# Patient Record
Sex: Female | Born: 1980 | Race: White | Hispanic: Yes | Marital: Married | Smoking: Never smoker
Health system: Southern US, Community
[De-identification: ages and names within clinical notes are randomized; demographics above are authoritative.]

## PROBLEM LIST (undated history)

## (undated) HISTORY — PX: NO PAST SURGERIES: SHX2092

---

## 2012-11-06 ENCOUNTER — Encounter: Payer: Self-pay | Admitting: Gynecology

## 2012-11-06 ENCOUNTER — Ambulatory Visit (INDEPENDENT_AMBULATORY_CARE_PROVIDER_SITE_OTHER): Payer: BC Managed Care – PPO | Admitting: Gynecology

## 2012-11-06 VITALS — BP 126/80 | Ht 62.0 in | Wt 161.0 lb

## 2012-11-06 DIAGNOSIS — N915 Oligomenorrhea, unspecified: Secondary | ICD-10-CM

## 2012-11-06 DIAGNOSIS — N946 Dysmenorrhea, unspecified: Secondary | ICD-10-CM

## 2012-11-06 DIAGNOSIS — N949 Unspecified condition associated with female genital organs and menstrual cycle: Secondary | ICD-10-CM

## 2012-11-06 DIAGNOSIS — R635 Abnormal weight gain: Secondary | ICD-10-CM | POA: Insufficient documentation

## 2012-11-06 DIAGNOSIS — R102 Pelvic and perineal pain: Secondary | ICD-10-CM

## 2012-11-06 LAB — TSH: TSH: 0.835 u[IU]/mL (ref 0.350–4.500)

## 2012-11-06 MED ORDER — DOXYCYCLINE HYCLATE 100 MG PO CAPS: ORAL_CAPSULE | ORAL | Status: DC

## 2012-11-06 NOTE — Progress Notes (Signed)
Patient is a 32 year old gravida 2 para 2 new patient to the practice who is having difficulty conceiving. Her primary physician in [facility] [place] Dr. Caleb Popp who has been doing her lab work. Patient stated she had a Pap smear was normal in March of this year. Patient has been with the same partner that she had her 2 previous children. Patient stated that her first pregnancy to 6 years to conceive and in [place] she had laparoscopy and was informed that her tubes may have been blocked and it appears that they may have done some form of tubal plasty because patient stated that after worse she conceived. She did not have problems getting pregnant with a second child. Patient states that sometimes she may go up to 2 months without a menstrual cycle. She states that she has gained weight recently. She denies any unusual headache, visual disturbance, or any nipple discharge. Patient had gestational diabetes with her previous pregnancy and has a strong family history diabetes. Patient does have dysmenorrhea and local discomfort at times and dyspareunia infrequently.  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 The following lateral be ordered today: TSH, prolactin, hemoglobin A1c. We will schedule an HSG with her upcoming menstrual cycle and will also obtain a semen analysis from her husband. Patient will be placed on Vibramycin 100 mg to take one by mouth twice a day for 3 days starting the day before the HSG for prophylaxis. She was instructed to begin taking prenatal vitamins. She will make an appointment to  see me after all the above studies are completed. All the above instructions were provided in Spanish in written and verbal format.

## 2012-11-06 NOTE — Patient Instructions (Addendum)
LLamar a  Claudia [phone] cuando empieze el periodo para que te haga la siguientes cita:  [facility] el [facility]  Cita con Dr. Lily Peer la semana despues del estudio  Cita para el estudio del semen de su esposo  Tomar antibiotico [employer] veces al dia empezando el dia antes del estudio HSG  Infertilidad (Infertility) QU ES LA INFERTILIDAD?  La infertilidad normalmente se define como la incapacidad para quedar embarazada luego de un ao de relaciones sexuales regulares sin la utilizacin de mtodos anticonceptivos. O la incapacidad de llevar a trmino Chartered loss adjuster y Warehouse manager el beb. La tasa de infertilidad en los [place] es de alrededor del 10%. El [address] es el resultado de una cadena de sucesos. La mujer debe liberar el vulo de uno de sus ovarios (ovulacin). El vulo debe fertilizarse con el esperma. Luego viaja a travs de las trompas de Falopio hacia el tero (matriz), donde se une a la pared del tero y crece. El hombre debe tener suficiente esperma y el esperma debe unirse con el vulo (fertilizar) en el momento justo. El vulo fertilizado debe luego unirse al interior del tero. Esto parece simple, pero pueden ocurrir [employer] cosas que evitan que el embarazo se produzca.  DE QUIN ES EL PROBLEMA?  Un 20% de los casos de infertilidad se deben a problemas del hombres (factores masculinos) y un 65% se debe a problemas de la mujer (factores femeninos). Otras causas pueden ser Neomia Dear combinacin de factores masculinos y femeninos o a causas desconocidas.  CULES SON LAS CAUSAS DE LA INFERTILIDAD EN EL HOMBRE?  La infertilidad en el hombre a menudo se debe a problemas para producir el esperma o hacer que el mismo llegue al vulo. Los problemas de esperma pueden existir desde el nacimiento o desarrollarse ms tarde debido a enfermedades o lesiones. Algunos hombres no producen esperma, o producen muy poco (oligospermia). Entre otros problemas se incluyen:  Disfuncin  sexual  Problemas hormonales o endocrinos.  La edad. La fertilidad del hombre disminuye con la edad, pero no tan temprano como la femenina.  Infecciones.  Problemas congnitos Defectos de nacimiento, como ausencia de los tubos que transportan el esperma (conductos deferentes).  Problemas genticos o de cromosomas.  Problemas de anticuerpos antiesperma.  Eyaculacin retrgrada (el esperma va hacia la vejiga).  Varicoceles, espermatoceles o tumores en los testculos.  El estilo de vida puede influir en el nmero y la calidad del esperma del hombre.  El alcohol y las drogas pueden reducir temporalmente la calidad del esperma.  Las toxinas [address], como los pesticidas y el plomo, pueden ocasionar algunos casos de infertilidad en hombres. CULES SON LAS CAUSAS DE LA INFERTILIDAD EN LA MUJER?   La infertilidad en las mujeres est causada mayormente por problemas con la ovulacin. Sin ovulacin, el vulo no puede fertilizarse.  Seales de problemas de ovulacin son perodos menstruales irregulares o ningn perodo.  Factores simples del estilo de vida, como el estrs, la dieta, o el entrenamiento deportivo, pueden afectar el balance hormonal de Medical laboratory scientific officer.  La edad. La fertilidad comienza a IT consultant alrededor de los 30 aos y [place] a Glass blower/designer de los 37.  Mucho menos a menudo, un desequilibrio hormonal por un problema mdico serio como un tumor en la glndula pituitaria, tiroides u otra enfermedad mdica crnica pueden ocasionar problemas de ovulacin.  Infecciones plvicas.  Sndrome de ovarios poliqusticos (aumento de hormonas masculinas, incapacidad de ovular).  Consumo de alcohol o drogas.  Toxinas ambientales, radiacin,  pesticidas y ciertos qumicos.  La edad es un factor importante en la infertilidad femenina.  La capacidad de los ovarios de una mujer para producir vulos disminuye con la edad, especialmente despus de los 35 aos. Alrededor de un  tercio de las parejas en las que la mujer tiene ms de 35 aos tendr problemas de infertilidad.  En el momento en el que alcanza la [place], cuando su perodo menstrual se detiene, la mujer ya no puede producir vulos ni quedar embarazada.  Otros problemas tambin pueden llevar a la infertilidad en las mujeres. Si las trompas de [employer]strom estn bloqueadas en [employer] extremos, el vulo no puede pasar a travs de los tubos [place]. Tejido cicatrizal (adhesiones) en la pelvis que pueden obstruir las trompas. Esto puede dar como resultado una enfermedad inflamatoria plvica, endometriosis o una ciruga por embarazo ectpico (en la que el vulo fertilizado se ha implantado fuera del tero) o cualquier ciruga plvica o abdominal que ocasione adherencias.  Tumores fibroides o plipos en el tero.  Anormalidades congnitas (de nacimiento) del tero.  Infecciones en el cuello del tero (cervicitis).  Estenosis cervical (estrechamiento).  Mucosidad cervical anormal.  Sndrome poliqustico de los ovarios.  Tener relaciones sexuales muy seguidas (todos [place] o 4 a 5 veces por semana).  Obesidad.  Anorexia.  Dficit nutricional.  Mucho ejercicio, con prdida de grasa corporal.  DES. Su madre ha recibido la hormona dietilstilbesterol cuando estaba embarazada de usted. CMO SE ANALIZA LA INFERTILIDAD?  Si ha estado tratando de quedar embarazada sin xito, podra querer buscar ayuda mdica. No debera esperar un ao de intentos sin xito antes de buscar ayuda profesional si:  Tiene mas de 35 aos de edad  Tiene razones para creer que podra tener problemas de fertilidad. Un examen mdico determinar las causas de infertilidad de la pareja, Normalmente el proceso comienza con:  Exmenes fsicos  Historias clnicas de ambos miembros de la pareja.  Historiales sexuales de ambos miembros de la pareja. Si no hay un problema evidente, como relaciones sexuales en tiempos  inadecuados o ausencia de ovulacin, se necesitarn Academic librarian.   Para el hombre, los anlisis normalmente comienzan con pruebas de semen para observar:  La cantidad de esperma.  La forma del esperma.  El movimiento del esperma.  Realizar un historial clnico y quirrgico completo.  Examen fsico.  Control de infecciones en los rganos reproductivos. Podrn realizarle anlisis hormonales.   Para la mujer, el primer paso del anlisis es saber si ovula cada mes. Hay diferentes modos de Designer, industrial/product. Por ejemplo, puede llevar un registro de los cambios en la temperatura corporal por la maana y la textura de la mucosidad cervical. Otra herramienta es un kit de prueba de ovulacin casero, que puede comprarse en la farmacia.  Tambin pueden realizarse controles de ovulacin en el consultorio mdico, mediante anlisis de sangre para observar los niveles de hormonas o pruebas de [employer] ovarios. Si la mujer est ovulando, se necesitarn realizar ms exmenes. Algunas femeninas comunes incluyen:  Histerosalpingografa: Se realizan rayos x de las trompas de Falopio y el tero con una inyeccin de [place]. Mostrar si las trompas estn abiertas y la forma del tero.  Laparoscopa: Un anlisis de las trompas y otros rganos femeninos para Copywriter, advertising. Se utiliza un tubo con iluminacin llamado laparoscopio para observar el interior del abdomen.  Biopsia endometrial: Se toma una muestra del tejido del tero Film/video editor del perodo menstrual, para ver si el  tejido le indica si est ovulando.  Prueba de ultrasonido transvaginal: Examina los rganos femeninos.  Histeroscopa: Utiliza un tubo con iluminacin para examinar el cuello del tero y el tero y ver si hay anormalidades dentro del mismo. TRATAMIENTO Dependiendo de los [employer], se sugerirn IT sales professional. El tratamiento depende de la causa. Entre el 85 y el 90% de los casos de  infertilidad se tratan con drogas o [place].   Hay varias drogas para la fertilidad que pueden utilizarse para las mujeres con problemas de ovulacin. Es importante hablar con el profesional que la asiste sobre la droga a Chemical engineer. Deber comprender los beneficios y efectos colaterales de las drogas. Segn el tipo de droga para la fertilidad y la dosis [place], algunas mujeres podran tener embarazos mltiples (mellizos).  De ser [employer], se puede realizar una ciruga para reparar daos en los ovarios de la [place], trompas de [place], el cuello del tero o el tero.  Tratamiento quirrgico o mdico para la endometriosis o el sndrome de ovario poliqustico. A veces, los problemas de fertilidad en el hombre pueden corregirse con medicamentos o [place].  Inseminacin intrauterina, IUI, de esperma en concordancia con la ovulacin.  Cambios en el estilo de vida, si esta es la causa (prdida de [place], aumento de ejercicios, dejar de fumar, beber excesivamente o tomar drogas ilegales).  Otros tipos de ciruga:  Extirpar tumores por dentro o sobre el tero.  Eliminar tejido cicatrizal de dentro del tero.  Arreglar trompas obstruidas.  Eliminar tejido cicatrizal en la pelvis y alrededor de los rganos femeninos. QU ES LA TECNOLOGA DE REPRODUCCIN ASISTIDA (ART)?  La tecnologa de reproduccin asistida (ART) utiliza mtodos especiales para ayudar a parejas infrtiles. En esta tcnica se manipula tanto el vulo de la mujer como el esperma del hombre. El xito depende de muchos factores. La ART puede ser cara y [address]. Pero ha hecho posible que muchas parejas tengan hijos que de otra manera no hubieran podido. A continuacin se enumeran algunos mtodos:  Fertilizacin. In Vitro (FIV). In Vitro (IVF) es un procedimiento que se hizo famoso con el nacimiento en 1978 de [place], el primer "beb de probeta" del mundo. Se utiliza cuando las trompas de [employer]strom de la mujer estn  bloqueadas o cuando el hombre tiene poco esperma. Se utiliza una droga para estimular los ovarios y producir mltiples vulos. Una vez maduros, los vulos se retiran y se Industrial/product designer en una placa de cultivo con el esperma del hombre para la fertilizacin. Luego de aproximadamente 40 horas, los vulos se examinan para ver si han sido fertilizados por el esperma y se han dividido en clulas. Esos vulos fertilizados (embriones) se colocan luego en el tero de la mujer. Esto evita el paso por las trompas de [place].  La transferencia intrafalopiana de gametas (GIFT) es similar al IVF, pero se utiliza cuando la mujer tiene al menos una trompa de falopio normal. Se colocan de tres a cinco vulos en la trompa de Falopio, junto con el esperma del hombre, para que la fertilizacin se realice dentro del cuerpo de Architectural technologist.  La transferencia intrafalopiana de cigotos (ZIFT), tambin se denomina transferencia embrionaria, y [place] el IVF con el GIFT. Los vulos retirados de los ovarios de la mujer se Land laboratorio y se Industrial/product designer en las trompas de [employer]strom en lugar de en el tero.  Los procedimientos de ART a menudo suponen la utilizacin de donantes de vulos (de [place] mujer) o de embriones congelados  previamente. Los donantes de vulos pueden utilizarse si la mujer tiene [employer] ovarios o posee una enfermedad gentica que podra transmitirla al beb.  Cuando se realiza una ART hay mayor riesgo de embarazos mltiples, mellizos, trillizos o ms.  La inyeccin de esperma intracitoplasmtica es un procedimiento que inyecta un espermatozoide en el vulo para fertilizarlo.  El trasplante embrionario es un procedimiento que comienza con un embrin que se ha desarrollado en un medio especial (solucin qumica) preparado para mantener el embrin vivo por 2 a 5 das, y Conservation officer, historic buildings. En los [employer] que no puede encontrarse la causa y Firefighter no se produce, podr considerarse la  adopcin. Document Released: 03/26/2007 Document Revised: 05/29/2011 [facility] Patient Information 2014 [place], [place]. Histerosalpingografa  (Hysterosalpingography) La histerosalpingografa es un procedimiento que [place] rayos X y [place] sustancia de contraste para observar el interior del tero y las trompas de [place]. La sustancia de contraste se [employer] tero a travs de la vagina y el cuello uterino, [place] se toman radiografas. Este procedimiento puede ayudar al mdico a diagnosticar tumores , adherencias o anormalidades estructurales en el tero. Generalmente se [place] para determinar las razones por las que una mujer no puede tener hijos (infertilidad).  INFORME A SU MDICO SOBRE:   Alergias a medicamentos o alimentos, especialmente a los frutos de mar.  Medicamentos que [place], incluyendo vitaminas, hierbas, gotas oftlmicas, medicamentos de venta libre y cremas.  Uso de corticoides (por va oral o cremas).  Problemas anteriores debido a anestsicos o a medicamentos que [employer] sensibilidad.  Antecedentes de hemorragias o cogulos sanguneos.  Cirugas plvicas previas.  Otros problemas de salud, incluyendo diabetes y problemas renales.  Posible embarazo.  Cualquier alergia al yodo o al contraste usado para tomar radiografas (sustancia de [place])..  Las infecciones plvicas recientes o enfermedades de transmisin sexual (ETS). RIESGOS Y COMPLICACIONES   Infeccin en la membrana que recubre interiormente al tero (endometritis) o en las trompas de Falopio (salpingitis).  [place]ao o perforacin del tero o las trompas de [place].  Reaccin alrgica a la sustancia de [place] para tomar la radiografa. ANTES DEL PROCEDIMIENTO   Planificar el procedimiento despus que finalice su perodo, pero antes de su prxima ovulacin. Esto ocurre por lo general [employer] 5 y 10 de su ltimo perodo. El [place] 1 es Film/video editor de su perodo.  Consulte a  su mdico si debe cambiar o suspender los medicamentos que toma habitualmente.  Podr comer y beber normalmente.  Le administrarn un medicamento para relajarse (sedante) o un analgsico de venta libre para Paramedic las molestias durante el procedimiento.  Antes de comenzar el procedimiento vace la vejiga PROCEDIMIENTO   Deber acostarse en una mesa de radiografas con los pies en los estribos.  Le colocarn en la vagina un dispositivo llamado espculo . Esto permite al mdico observar el interior de la vagina hasta el cuello del tero.  El cuello del tero se lava con un jabn especial.  Luego se pasa un tubo delgado y flexible a travs del cuello del tero hasta el tero.  En el tubo se colocar una sustancia de [place]. Conley Rolls tomarn varias radiografas a medida que el contraste pasa a travs del tero y las trompas de [place].  Despus del procedimiento se retira el tubo.  El procedimiento suele durar entre 15 y 30 minutos. DESPUS DEL PROCEDIMIENTO   La mayor parte de la sustancia de contraste se eliminar naturalmente.  Podra ser necesario que use un apsito sanitario.  Podr sentir algunos clicos y Winferd Humphrey pequea prdida. Luego de 24 horas deben desaparecer.  Consulte con su mdico la fecha en que los resultados estarn disponibles. Asegrese de [employer]. Document Released: 05/29/2011 [facility] Patient Information 2014 [place], [place].

## 2012-11-14 ENCOUNTER — Telehealth: Payer: Self-pay

## 2012-11-14 ENCOUNTER — Other Ambulatory Visit: Payer: Self-pay | Admitting: Gynecology

## 2012-11-14 DIAGNOSIS — IMO0002 Reserved for concepts with insufficient information to code with codable children: Secondary | ICD-10-CM

## 2012-11-14 NOTE — Telephone Encounter (Signed)
Note per Debarah Crape as patient is SPanish speaking. "  Please schedule HSG for JF's patient. Her LMP 11-13-12. She wants morning appt. I will call her with info. Thx

## 2012-11-14 NOTE — Telephone Encounter (Signed)
Ruth Rogers spoke with patient and scheduled her for 11/21/12 at [facility] for HSG.  Order put in system.

## 2012-11-14 NOTE — Telephone Encounter (Signed)
Ruth Rogers will remind her regarding taking Vibramycin bid day before, day of and day after procedure.  Dr. Glenetta Hew had prescribed this at her office visit.

## 2012-11-21 ENCOUNTER — Ambulatory Visit (HOSPITAL_COMMUNITY)
Admission: RE | Admit: 2012-11-21 | Discharge: 2012-11-21 | Disposition: A | Discharge status: Home / Self Care | Payer: BC Managed Care – PPO | Source: Ambulatory Visit | Attending: Gynecology | Admitting: Gynecology

## 2012-11-21 DIAGNOSIS — N979 Female infertility, unspecified: Secondary | ICD-10-CM | POA: Insufficient documentation

## 2012-11-21 DIAGNOSIS — IMO0002 Reserved for concepts with insufficient information to code with codable children: Secondary | ICD-10-CM

## 2012-11-21 MED ORDER — IOHEXOL 300 MG/ML  SOLN
20.0000 mL | Freq: Once | INTRAMUSCULAR | Status: AC | PRN
  Administered 2012-11-21: 20 mL

## 2012-12-04 ENCOUNTER — Encounter: Payer: Self-pay | Admitting: Gynecology

## 2012-12-19 ENCOUNTER — Ambulatory Visit (INDEPENDENT_AMBULATORY_CARE_PROVIDER_SITE_OTHER): Payer: BC Managed Care – PPO | Admitting: Gynecology

## 2012-12-19 ENCOUNTER — Ambulatory Visit: Payer: BC Managed Care – PPO | Admitting: Gynecology

## 2012-12-19 ENCOUNTER — Encounter: Payer: Self-pay | Admitting: Gynecology

## 2012-12-19 VITALS — BP 116/72

## 2012-12-19 DIAGNOSIS — N979 Female infertility, unspecified: Secondary | ICD-10-CM

## 2012-12-19 DIAGNOSIS — N915 Oligomenorrhea, unspecified: Secondary | ICD-10-CM

## 2012-12-19 DIAGNOSIS — R635 Abnormal weight gain: Secondary | ICD-10-CM

## 2012-12-19 LAB — HEMOGLOBIN A1C: Mean Plasma Glucose: 114 mg/dL (ref <117)

## 2012-12-19 MED ORDER — LETROZOLE 2.5 MG PO TABS: ORAL_TABLET | ORAL | Status: DC

## 2012-12-19 NOTE — Patient Instructions (Addendum)
Control del colesterol  Los niveles de colesterol en el organismo estn determinados significativamente por su dieta. Los niveles de colesterol tambin se relacionan con la enfermedad cardaca. El material que sigue ayuda a Software engineer relacin y a Chiropractor qu puede hacer para mantener su corazn sano. No todo el colesterol es [place]. Las lipoprotenas de baja densidad (LDL) forman el colesterol "malo". El colesterol malo puede ocasionar depsitos de grasa que se acumulan en el interior de las arterias. Las lipoprotenas de alta densidad (HDL) es el colesterol "bueno". Ayuda a remover el colesterol LDL "malo" de la [place]. El colesterol es un factor de riesgo muy importante para la enfermedad cardaca. Otros factores de riesgo son la hipertensin arterial, el hbito de fumar, el estrs, la herencia y [place].   El msculo cardaco obtiene el suministro de sangre a travs de las arterias coronarias. Si su colesterol LDL ("malo") est elevado y el HDL ("bueno") es bajo, tiene un factor de riesgo para que se formen depsitos de Holiday representative en las arterias coronarias (los vasos sanguneos que suministran sangre al corazn). Esto hace que haya menos lugar para que la sangre circule. Sin la suficiente sangre y oxgeno, el msculo cardaco no puede funcionar correctamente, y usted podr sentir dolores en el pecho (angina pectoris). Cuando una arteria coronaria se cierra completamente, una parte del msculo cardaco puede morir (infarto de miocardio).  CONTROL DEL COLESTEROL Cuando el profesional que lo asiste enva la sangre al laboratorio para Artist nivel de colesterol, puede realizarle tambin un perfil completo de los lpidos. Con esta prueba, se puede determinar la cantidad total de colesterol, as como los niveles de LDL y HDL. Los triglicridos son un tipo de grasa que circula en la  sangre y que tambin puede utilizarse para determinar el riesgo de enfermedad cardaca. En la siguiente tabla se establecen los nmeros ideales: Prueba: Colesterol total  Menos de 200 mg/dl.  Prueba: LDL "colesterol malo"  Menos de 100 mg/dl.   Menos de 70 mg/dl si tiene riesgo muy elevado de sufrir un ataque cardaco o muerte cardaca sbita.  Prueba: HDL "colesterol bueno"  Mujeres: Ms de 50 mg/dl.   Hombres: Ms de 40 mg/dl.  Prueba: Trigliceridos  Menos de 150 mg/dl.    CONTROL DEL COLESTEROL CON DIETA Aunque factores como el ejercicio y el estilo de vida son importantes, la "primera lnea de ataque" es la dieta. Esto se debe a que se sabe que ciertos alimentos hacen subir el colesterol y otros lo [place]. El objetivo debe ser [employer] alimentos, de modo que tengan un efecto sobre el colesterol y, an ms importante, Microbiologist las grasas saturadas y trans con otros tipos de grasas, como las monoinsaturadas y las poliinsaturadas y cidos grasos omega-3 . En promedio, una persona no debe consumir ms de 15 a 17 g de grasas saturadas por [employer]. Las grasas saturadas y trans se consideran grasas "malas", ya que elevan el colesterol LDL. Las grasas saturadas se encuentran principalmente en productos animales como carne, [place] y crema. Pero esto no significa que usted Marketing executive todas sus comidas favoritas. Actualmente, como lo muestra el cuadro que figura al final de este documento, hay sustitutos de buen sabor, bajos en grasas y en colesterol, para la mayora de los alimentos que a usted Musician. Elija aquellos alimentos alternativos que sean bajos en grasas o sin grasas. Elija cortes de carne del cuarto trasero o lomo ya que estos cortes son los que tienen menor cantidad de Holiday representative  y colesterol. El pollo (sin piel), el pescado, la carne de ternera, y la [place] molida son excelentes opciones. Elimine las carnes [employer] o el salami. Los [employer] o nada de grasas saturadas. Cuando consuma carne [place], carne de aves de corral, o pescado, hgalo en porciones de 85 gramos (3 onzas). Las grasas trans tambin se llaman "aceites parcialmente hidrogenados". Son aceites manipulados cientficamente de [place] que son slidos a Publishing rights manager, tienen una larga vida y Glass blower/designer sabor y la textura de los alimentos a los que se Scientist, clinical (histocompatibility and immunogenetics). Las grasas trans se encuentran en la [place], [place], crackers y alimentos horneados.  Para hornear y cocinar, el aceite es un excelente sustituto para la [place]. Los aceites monoinsaturados tienen un beneficio particular, ya que se cree que disminuyen el colesterol LDL (colesterol malo) y elevan el HDL. Deber evitar los aceites tropicales saturados como el de coco y el de [place].  Recuerde, adems, que puede comer sin restricciones los grupos de alimentos que son naturalmente libres de grasas saturadas y Neurosurgeon trans, entre los que se incluyen el pescado, las frutas (excepto el [place]), verduras, frijoles, cereales (cebada, arroz, [place], trigo) y las pastas (sin salsas con crema)   IDENTIFIQUE LOS ALIMENTOS QUE DISMINUYEN EL COLESTEROL  Pueden disminuir el colesterol las fibras solubles que estn en las frutas, como las [place], en los vegetales como el brcoli, las patatas y las zanahorias; en las legumbres como frijoles, guisantes y Therapist, occupational; y en los cereales como la cebada. Los alimentos fortificados con fitosteroles tambin Engineer, production. Debe consumir al menos 2 g de estos alimentos a diario para Financial planner de disminucin de [place].  En el supermercado, lea las etiquetas de los envases para identificar los alimentos bajos en grasas saturadas, libres de grasas trans y bajos en [place], . Elija quesos que tengan solo de 2 a 3 g de grasa saturada por onza (28,35 g). Use una margarina que no dae el corazn, [place] de grasas trans o aceite parcialmente hidrogenado. Al comprar  alimentos horneados (galletitas dulces y [place]) evite el aceite parcialmente hidrogenado. Los panes y bollos debern ser de granos enteros (harina de maz o de avena entera, en lugar de "harina" o "harina enriquecida"). Compre sopas en lata que no sean cremosas, con bajo contenido de sal y sin grasas adicionadas.   TCNICAS DE PREPARACIN DE LOS ALIMENTOS  Nunca fra los alimentos en aceite abundante. Si debe frer, hgalo en poco aceite y removiendo [place], porque as se utilizan muy pocas grasas, o utilice un spray antiadherente. Cuando le sea posible, hierva, hornee o ase las carnes y cocine los vegetales al vapor. En vez de [employer] con mantequilla o [place], utilice limn y hierbas, pur de Psychologist, educational y canela (para las calabazas y batatas), yogurt y salsa descremados y aderezos para ensaladas bajos en contenido graso.   BAJO EN GRASAS SATURADAS / SUSTITUTOS BAJOS EN GRASA  [place] / Grasas saturadas (g)  Evite: Bife, corte graso (3 oz/85 g) / 11 g   Elija: Bife, corte magro (3 oz/85 g) / 4 g   Evite: [place]a (3 oz/85 g) / 7 g   Elija:  [place] magra (3 oz/85 g) / 5 g   Evite: Jamn (3 oz/85 g) / 6 g   Elija:  Jamn magro (3 oz/85 g) / 2.4 g   Evite: Pollo, con piel (3 oz/85 g), Carne oscura / 4 g   Elija:  Pollo, sin piel (  3 oz/85 g), Carne oscura / 2 g   Evite: Pollo, con piel (3 oz/85 g), Carne magra / 2.5 g   Elija: Pollo, sin piel (3 oz/85 g), Carne magra / 1 g  Lcteos / Grasas saturadas (g)  Evite: Leche entera (1 taza) / 5 g   Elija: Leche con bajo contenido de grasa, 2% (1 taza) / 3 g   Elija: Leche con bajo contenido de grasa, 1% (1 taza) / 1.5 g   Elija: Leche descremada (1 taza) / 0.3 g   Evite: Queso duro (1 oz/28 g) / 6 g   Elija: Queso descremado (1 oz/28 g) / 2-3 g   Evite: Queso cottage, 4% grasa (1 taza)/ 6.5 g   Elija: Queso cottage con bajo contenido de grasa, 1% grasa (1 taza)/ 1.5 g   Evite: Helado (1 taza) / 9 g    Elija: Sorbete (1 taza) / 2.5 g   Elija: Yogurt helado sin contenido de grasa (1 taza) / 0.3 g   Elija: Barras de fruta congeladas / vestigios   Evite: Crema batida (1 cucharada) / 3.5 g   Elija: Batidos glac sin lcteos (1 cucharada) / 1 g  Condimentos / Grasas saturadas (g)  Evite: Mayonesa (1 cucharada) / 2 g   Elija: Mayonesa con bajo contenido de grasa (1 cucharada) / 1 g   Evite: [place] (1 cucharada) / 7 g   Elija: Margarina extra light (1 cucharada) / 1 g   Evite: Aceite de coco (1 cucharada) / 11.8 g   Elija: Aceite de oliva (1 cucharada) / 1.8 g   Elija: Aceite de maz (1 cucharada) / 1.7 g   Elija: Aceite de crtamo (1 cucharada) / 1.2 g   Elija: Aceite de girasol (1 cucharada) / 1.4 g   Elija: Aceite de soja (1 cucharada) / 2.4 g   Elija: Aceite de canola (1 cucharada) / 1 g  Document Released: 03/06/2005 Document Revised: 11/16/2010 [facility] Patient Information 2012 [place], [place]. Exercise to Lose Weight Exercise and a healthy diet may help you lose weight. Your doctor may suggest specific exercises. EXERCISE IDEAS AND TIPS  Choose low-cost things you enjoy doing, such as walking, bicycling, or exercising to workout videos.   Take stairs instead of the elevator.   Walk during your lunch break.   Park your car further away from work or school.   Go to a gym or an exercise class.   Start with 5 to 10 minutes of exercise each day. Build up to 30 minutes of exercise 4 to 6 days a week.   Wear shoes with good support and comfortable clothes.   Stretch before and after working out.   Work out until you breathe harder and your heart beats faster.   Drink extra water when you exercise.   Do not do so much that you hurt yourself, feel dizzy, or get very short of breath.  Exercises that burn about 150 calories:  Running 1  miles in 15 minutes.   Playing volleyball for 45 to 60 minutes.   Washing and waxing a car for 45 to 60 minutes.    Playing touch football for 45 minutes.   Walking 1  miles in 35 minutes.   Pushing a stroller 1  miles in 30 minutes.   Playing basketball for 30 minutes.   Raking leaves for 30 minutes.   Bicycling 5 miles in 30 minutes.   Walking 2 miles in 30 minutes.   Dancing for  30 minutes.   Shoveling snow for 15 minutes.   Swimming laps for 20 minutes.   Walking up stairs for 15 minutes.   Bicycling 4 miles in 15 minutes.   Gardening for 30 to 45 minutes.   Jumping rope for 15 minutes.   Washing windows or floors for 45 to 60 minutes.  Document Released: 04/08/2010 Document Revised: 11/16/2010 Document Reviewed: 04/08/2010 [facility] Patient Information 2012 [place], [place].

## 2012-12-19 NOTE — Progress Notes (Signed)
Patient presented to the office today to discuss the results of her previously scheduled HSG and husband's semen analysis as a result of patient's history of secondary infertility. Patient is a 32 year old gravida 2 para 2 who was seen in the office was 11/06/2012. Patient stated her first pregnancy to occur approximately 6 years to conceive in [place] and was successful after laparoscopy and tuboplasty. Her second pregnancy did not take long to obtain. Patient states that sometimes she will go up to 2 months without a menstrual cycle the past several months she has been regular. She does bleed very light for one to 3 days at most.She denies any unusual headache, visual disturbance, or any nipple discharge. Patient had gestational diabetes with her previous pregnancy and has a strong family history diabetes. Patient does have dysmenorrhea and local discomfort at times and dyspareunia infrequently.   Patient 11/06/2012 had a normal TSH, prolactin and her blood sugar was 100 nonstress. Patient states that a few days ago she felt queasy and weak and she thought that maybe she was pregnant and had a home pregnancy test was negative.  HSG and semen analysis normal recently. Slightly viscous semen analysis.  Assessment/plan: Secondary infertility possible ovulatory dysfunction possible PCO S. Patient has a BMI of 29.4. Patient will be provided literature and information Spanish on exercise and diet. We'll check her hemoglobin A1c today. Patient will be started on ovulation induction medication with letrozole (Femera) 2.5 mg to take one daily from day 3-7 of her cycle. She'll use the ovulation predictor kit to time or intercourse. She'll return to the office on day 20 or 21 or 22 of her cycle to check a serum progesterone level and then to see me 2 weeks afterwards. The risks benefits and pros and cons of ovulation induction medication were discussed with the patient. She was started also on prenatal vitamins. All the  above was provided in written format in Spanish. All questions were answered.

## 2013-01-27 ENCOUNTER — Other Ambulatory Visit: Payer: BC Managed Care – PPO

## 2013-01-28 ENCOUNTER — Other Ambulatory Visit: Payer: BC Managed Care – PPO

## 2013-01-28 DIAGNOSIS — N979 Female infertility, unspecified: Secondary | ICD-10-CM

## 2013-01-28 DIAGNOSIS — N915 Oligomenorrhea, unspecified: Secondary | ICD-10-CM

## 2013-02-10 ENCOUNTER — Ambulatory Visit (INDEPENDENT_AMBULATORY_CARE_PROVIDER_SITE_OTHER): Payer: Self-pay | Admitting: Gynecology

## 2013-02-10 ENCOUNTER — Encounter: Payer: Self-pay | Admitting: Gynecology

## 2013-02-10 VITALS — BP 122/74

## 2013-02-10 DIAGNOSIS — E663 Overweight: Secondary | ICD-10-CM

## 2013-02-10 DIAGNOSIS — N915 Oligomenorrhea, unspecified: Secondary | ICD-10-CM

## 2013-02-10 DIAGNOSIS — E282 Polycystic ovarian syndrome: Secondary | ICD-10-CM

## 2013-02-10 MED ORDER — METFORMIN HCL 500 MG PO TABS: 500.0000 mg | ORAL_TABLET | Freq: Two times a day (BID) | ORAL | Status: DC

## 2013-02-10 NOTE — Patient Instructions (Signed)
Metformin tablets Qu es este medicamento? La METFORMINA se usa para tratar la diabetes tipo 2. Ayuda a controlar el nivel de azcar en la sangre. El tratamiento se combina con ejercicios y una dieta. Este medicamento se puede usar solo o con otros medicamentos para la diabetes, incluyendo la insulina. Este medicamento puede ser utilizado para otros usos; si tiene alguna pregunta consulte con su proveedor de atencin mdica o con su farmacutico. MARCAS COMERCIALES DISPONIBLES: Glucophage Qu le debo informar a mi profesional de la salud antes de tomar este medicamento? Necesita saber si usted presenta alguno de los siguientes problemas o situaciones: -anemia -si consume bebidas alcohlicas con frecuencia -se deshidrata con facilidad -ataque cardiaco -insuficiencia cardiaca tratada con medicamentos -enfermedad renal -enfermedad heptica -ovarios poliqusticos -infeccin o lesin severa -vmito -una reaccin alrgica o inusual a la metformina, a otros medicamentos, alimentos, colorantes o conservantes -si est embarazada o buscando quedar embarazada -si est amamantando a un beb Cmo debo utilizar este medicamento? Tome este medicamento por va oral. Tmelo con las comidas. Trague las tabletas con un vaso de agua. Siga las instrucciones de la etiqueta del medicamento. Tome sus dosis a intervalos regulares. No tome su medicamento con una frecuencia mayor a la indicada. Hable con su pediatra para informarse acerca del uso de este medicamento en nios. Aunque este medicamento se puede recetar a nios tan menores como de 10 aos de edad para condiciones selectivas, las precauciones se aplican. Sobredosis: Pngase en contacto inmediatamente con un centro toxicolgico o una sala de urgencia si usted cree que haya tomado demasiado medicamento. ATENCIN: Este medicamento es solo para usted. No comparta este medicamento con nadie. Qu sucede si me olvido de una dosis? Si olvida una dosis, tmela  lo antes posible. Si es casi la hora de su dosis siguiente, tome slo esa dosis. No tome dosis adicionales o dobles. Qu puede interactuar con este medicamento? No tome esta medicina con ninguno de los siguientes medicamentos: -dofetilida -gatifloxacino -ciertos agentes de contraste administrados antes de un procedimiento con rayos X, tomografas computadas (CT), MRI u otros procedimientos Muchos medicamentos pueden aumentar o reducir el nivel de azcar en la sangre, tales como: -digoxina -diurticos -hormonas femeninas, como estrgenos, progestinas o pldoras anticonceptivas -isoniazida -medicamentos para presin sangunea, enfermedad cardiaca, pulso cardiaco irregular -morfina -cido nicotnico -fenotiazinas, tales como clorpromacina, mesoridazina, proclorperazina, tioridazina -fenitona -procainamida -quinidina -quinina -ranitidina -medicamentos esteroideos, como la prednisona o la cortisona -medicamentos estimulantes para trastornos de atencin, perder peso o mantenerse despierto -medicamentos tiroideos -trimetoprima -vancomicina Puede ser que esta lista no menciona todas las posibles interacciones. Informe a su profesional de la salud de todos los productos a base de hierbas, medicamentos de venta libre o suplementos nutritivos que est tomando. Si usted fuma, consume bebidas alcohlicas o si utiliza drogas ilegales, indqueselo tambin a su profesional de la salud. Algunas sustancias pueden interactuar con su medicamento. A qu debo estar atento al usar este medicamento? Visite a su mdico o a su profesional de la salud para chequear su evolucin peridicamente. Un examen llamada HbA1C (A1C) ser monitoreado. Es un simple examen de sangre. Mide su control de azcar en la sangre durante los ltimos 2 a 3 meses. Usted recibir este examen cada 3 a 6 meses. Aprenda cmo controlar el nivel de azcar en la sangre. Aprenda a reconocer los sntomas de bajo y alto nivel de azcar en la  sangre y cmo tratarlos. Siempre lleve consigo una fuente rpida de azcar por si acaso experimenta sntomas de bajo nivel de azcar en   la sangre. Ejemplos incluyen caramelos duros o tabletas de glucosa. Asegrese de que los miembros de su familia sepan que se puede ahogar si come o bebe mientras tiene sntomas graves de bajo nivel de azcar en la sangre, tales como convulsiones o prdida del conocimiento. Deben obtener ayuda mdica inmediatamente. Informe a su mdico o a su profesional de la salud si tiene alto nivel de azcar en la sangre. Tal vez sea necesario cambiar la dosis de su medicamento. Si est enfermo o haciendo mucho ms ejercicio que el habitual, puede ser necesario cambiar la dosis de su medicamento. No se salte comidas. Pregunte a su mdico o a su profesional de la salud si debe evitar el consumo de alcohol. Muchos productos de venta libre para tos y resfros contienen azcar y alcohol. Estos pueden afectar el nivel de azcar en la sangre. Este medicamento puede provocar la ovulacin en mujeres premenopusicas que no tienen periodos menstruales regulares. Esto puede aumentar la posibilidad de quedarse embarazada. No debe tomar este medicamento si se queda embarazada o si cree que est embarazada. Consulte a su mdico o su profesional de la salud sobre sus opciones anticonceptivas mientras est tomando este medicamento. Si cree que est embarazada, consulte a su mdico o su profesional de la salud inmediatamente. Si va a someterse a una operacin, IRM (MRI), tomografa computarizada u otro procedimiento, informe a su mdico que est tomando este medicamento. Usted podr necesitar dejar de tomar este medicamento antes del procedimiento.  Use una pulsera o cadena de identificacin mdica. Lleve consigo una tarjeta de identificacin con informacin sobre su enfermedad y detalles de sus medicamentos y los horarios de las dosis. Qu efectos secundarios puedo tener al utilizar este  medicamento? Efectos secundarios que debe informar a su mdico o a su profesional de la salud tan pronto como sea posible: -reacciones alrgicas como erupcin cutnea, picazn o urticarias, hinchazn de la cara, labios o lengua -problemas respiratorios -sensacin de desmayos o aturdimiento, cadas -dolores o molestias musculares -signos o sntomas de bajo nivel de azcar en la sangre tales como sentirse ansioso, confusin, mareos, aumento de apetito, debilidad o cansancio inusual, sudoracin, temblores, fro, irritabilidad, dolor de cabeza, visin borrosa, pulso cardaco rpido, prdida del conocimiento -pulso cardiaco irregular o lento -molestias o dolor de estmago inusual -cansancio o debilidad inusual Efectos secundarios que, por lo general, no requieren atencin mdica (debe informarlos a su mdico o a su profesional de la salud si persisten o si son molestos): -diarrea -dolor de cabeza -acidez de estmago -sabor metlico en la boca -nuseas -molestias estomacales, gases Puede ser que esta lista no menciona todos los posibles efectos secundarios. Comunquese a su mdico por asesoramiento mdico sobre los efectos secundarios. Usted puede informar los efectos secundarios a la FDA por telfono al 1-800-FDA-1088. Dnde debo guardar mi medicina? [place] fuera del alcance de los nios. Gurdela a temperatura ambiente, entre 15 y 30 grados C (59 y 86 grados F). Protjala de la humedad y de la luz. Deseche todo el medicamento que no haya utilizado, despus de la fecha de vencimiento. ATENCIN: Este folleto es un resumen. Puede ser que no cubra toda la posible informacin. Si usted tiene preguntas acerca de esta medicina, consulte con su mdico, su farmacutico o su profesional de la salud.  2014, Elsevier/Gold Standard. (2012-07-12 17:31:08)  

## 2013-02-10 NOTE — Progress Notes (Signed)
Patient is a 32 year old gravida 2 para 2 with history of secondary infertility/oligomenorrhea. Patient in [place] had undergone laparoscopy and tubal pregnancy and conceived with her first pregnancy shortly thereafter. Second pregnancy was spontaneous. Patient has had a workup here in the office to consist of a normal TSH, and prolactin her sugar was found to be slightly borderline at 110. HSG and semen analysis were normal with exception of slightly viscous semen. Patient has a BMI of 29.4 and we had discussed importance of regular exercise to help with her weight as well as her sugar and to help her ovulate. This is patient's second cycle on Femera 2.5 mg which she takes from day 3-7 of her cycle. Last month she had a normal menstrual cycle but her husband was out of town and did not have intercourse in a timely fashion. She had a normal serum progesterone level with a value of 11.3. She is currently on day 7 of her cycle and she will be started on metformin 500 mg twice a day to keep her blood sugars under control as well as to help with ovulation. The risks benefits and pros and cons were discussed in Spanish as well. She will continue to time her intercourse next week which will be day 13 and 14 of her cycle. It appears that she may have had trouble interpreting the ovulation predictor kit and I have calculated for her that regardless of the results of the ovulation predictor kit next week end which will be day 13 and 14 over cycle that she should have intercourse at that time. She will continue on her prenatal vitamins and she will contact us. Her husband will be in town that time. All the above instructions were provided in Spanish in written format as well.

## 2013-04-21 ENCOUNTER — Ambulatory Visit (INDEPENDENT_AMBULATORY_CARE_PROVIDER_SITE_OTHER): Payer: Self-pay | Admitting: Gynecology

## 2013-04-21 ENCOUNTER — Encounter: Payer: Self-pay | Admitting: Gynecology

## 2013-04-21 VITALS — BP 114/74

## 2013-04-21 DIAGNOSIS — N915 Oligomenorrhea, unspecified: Secondary | ICD-10-CM

## 2013-04-21 DIAGNOSIS — N979 Female infertility, unspecified: Secondary | ICD-10-CM

## 2013-04-21 MED ORDER — LETROZOLE 2.5 MG PO TABS: ORAL_TABLET | ORAL | Status: DC

## 2013-04-21 NOTE — Progress Notes (Signed)
Patient is a 32-year-old gravida 2 para 2 with history of secondary infertility/oligomenorrhea. Patient in [place] had undergone laparoscopy and tubal pregnancy and conceived with her first pregnancy shortly thereafter. Second pregnancy was spontaneous. Patient has had a workup here in the office to consist of a normal TSH, and prolactin her sugar was found to be slightly borderline at 110. HSG and semen analysis were normal with exception of slightly viscous semen. The patient is a BMI of 29.4. We discussed importance of regular exercise. Patient had been started on  Femera  2.5 mg daily 3 through 7 as a result of her oligomenorrhea and was to time her intercourse. She did so and had a normal serum progesterone level with a value of 11.3. She has not conceived and has gone through 4 cycles. One of the cycles her husband was not in town because he was traveling. Because of her pre-diabetes she was started on metformin 500 mg twice a day and has informed me  today that this is causing her to have much nausea and abdominal cramping. She's currently on her prenatal vitamins.  Her last menstrual period was January 9 and took the Femera as prescribed and days 3-7 and did notice on the ovulation predictor kit on day 12 and 13 a positive response and had intercourse. She is currently on day 25 of her cycle.  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 She's currently on her fourth cycle day 25. She is not conceive with this cycle were going to give her one more cycle along with sperm washing and IUI. Her husband will be out of town for the next several weeks so we will do this cycle starting in March. We'll have her decrease her metformin because of her side effects to one tablet daily. Will increase her Femera to 5 mg from day 3-7 starting in March and will also add dexamethasone 0.2 mg from day to-16 of that cycle in March. She will then  call the office when she notices any change in the ovulation predictor kit so that we can schedule sperm washing and intrauterine insemination. The risks benefits and pros and cons of those medications were discussed in detail in Spanish. 

## 2013-04-24 ENCOUNTER — Other Ambulatory Visit: Payer: Self-pay | Admitting: Gynecology

## 2013-04-24 MED ORDER — DEXAMETHASONE 0.5 MG PO TABS: ORAL_TABLET | ORAL | Status: DC

## 2013-05-23 ENCOUNTER — Other Ambulatory Visit: Payer: Self-pay | Admitting: Gynecology

## 2013-05-23 ENCOUNTER — Other Ambulatory Visit: Payer: Self-pay | Admitting: Anesthesiology

## 2013-05-23 MED ORDER — METFORMIN HCL 500 MG PO TABS: 500.0000 mg | ORAL_TABLET | Freq: Two times a day (BID) | ORAL | Status: DC

## 2013-05-23 MED ORDER — LETROZOLE 2.5 MG PO TABS: ORAL_TABLET | ORAL | Status: DC

## 2013-05-23 MED ORDER — DEXAMETHASONE 0.5 MG PO TABS: ORAL_TABLET | ORAL | Status: DC

## 2013-06-04 ENCOUNTER — Telehealth: Payer: Self-pay | Admitting: *Deleted

## 2013-06-04 NOTE — Telephone Encounter (Signed)
Pt called Ruth Rogers this morning had a +OPK needs IUI tomorrow morning. Sperm washing at 8am in [facility]. Pt informed by Rosemarie Ax and order faxed KW CMA

## 2013-06-05 ENCOUNTER — Ambulatory Visit (INDEPENDENT_AMBULATORY_CARE_PROVIDER_SITE_OTHER): Payer: Self-pay | Admitting: Gynecology

## 2013-06-05 ENCOUNTER — Encounter: Payer: Self-pay | Admitting: Gynecology

## 2013-06-05 VITALS — BP 124/80

## 2013-06-05 DIAGNOSIS — E663 Overweight: Secondary | ICD-10-CM

## 2013-06-05 DIAGNOSIS — N915 Oligomenorrhea, unspecified: Secondary | ICD-10-CM

## 2013-06-05 DIAGNOSIS — N979 Female infertility, unspecified: Secondary | ICD-10-CM

## 2013-06-05 DIAGNOSIS — Z3189 Encounter for other procreative management: Secondary | ICD-10-CM

## 2013-06-05 NOTE — Progress Notes (Signed)
   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  Patient with past history of oligomenorrhea. Ovulation induction medication previously attempted as follows: First cycle with clomiphene citrate 100 mg per day 5 through 9 unsuccessful Patient went for 4 cycles with  Femera  2.5 mg for days  3-7 with timing of intercourse with ovulation predictor kit no success despite positive response  The patient has had serum progesterone level which have indicated that she has responded to medication. She's had a previous normal HSG but husband's semen analysis was normal but this does.  Patient's last menstrual period was March 5 and from March 7-11 she took the Femera  2.5 mg daily and on day 14 which was yesterday she noted a positive change on ovulation predictor kit.  Sperm Washing today result:  Pre-wash: Volume 3.5  Motility 43.5%  Post wash Volume 0.78 Motility 61.5%  The patient was placed in the low lithotomy position in the examining table. A speculum was inserted. The sperm sample the post wash was inserted into the uterine cavity with a sterile catheter. She was asked to rest for 15 minutes and the exam room before leaving home. She'll have intercourse tonight in the next 2 nights. If she has a menstrual cycle in the next couple weeks or return to the office for discussion of further management plan:   

## 2013-06-06 ENCOUNTER — Encounter: Payer: Self-pay | Admitting: Gynecology

## 2013-07-15 ENCOUNTER — Ambulatory Visit (INDEPENDENT_AMBULATORY_CARE_PROVIDER_SITE_OTHER): Payer: Self-pay | Admitting: Gynecology

## 2013-07-15 ENCOUNTER — Encounter: Payer: Self-pay | Admitting: Gynecology

## 2013-07-15 VITALS — BP 128/76

## 2013-07-15 DIAGNOSIS — R7303 Prediabetes: Secondary | ICD-10-CM | POA: Insufficient documentation

## 2013-07-15 DIAGNOSIS — R7309 Other abnormal glucose: Secondary | ICD-10-CM

## 2013-07-15 DIAGNOSIS — E663 Overweight: Secondary | ICD-10-CM

## 2013-07-15 DIAGNOSIS — N915 Oligomenorrhea, unspecified: Secondary | ICD-10-CM

## 2013-07-15 MED ORDER — DEXAMETHASONE 0.5 MG PO TABS: ORAL_TABLET | ORAL | Status: DC

## 2013-07-15 MED ORDER — LETROZOLE 2.5 MG PO TABS: ORAL_TABLET | ORAL | Status: DC

## 2013-07-15 NOTE — Patient Instructions (Addendum)
Fertilizacin in vitro  (In Vitro Fertilization) La fertilizacin in vitro (FIV) es un tipo de Chartered certified accountant aplicada a la reproduccin asistida. La FIV consiste en una serie de procedimientos para tratar la infertilidad o problemas genticos con el objeto de prestar asistencia para la concepcin de un beb. Durante la FIV, los vulos se recuperan de los ovarios y se combinan con los espermatozoides en el laboratorio para fertilizarlos. Uno o ms vulos fertilizados (embriones) se insertan en el tero a travs del cuello uterino. Las candidatas para la FIV son:  Personas que sufren infertilidad.  Las mujeres que sufren una menopausia prematura o una falla ovrica.  Las mujeres a las que se les han extirpado ambos ovarios. En este caso, deber usarse una donante de vulos.  Mujeres que tienen daadas u obstruidas las trompas de [place] No hay lmite de edad para la FIV, pero no se recomienda para las mujeres postmenopusicas. La edad ideal para este procedimiento es de 80 aos o menos. A las mujeres de ms de 41 aos generalmente se les aconseja utilizar una donante de vulos durante la FIV para aumentar las probabilidades de [place]. INFORME A SU MDICO:   Cualquier alergia que tenga.  Todos los [employer] [place], incluyendo vitaminas, hierbas, gotas oftlmicas, cremas y medicamentos de venta libre.  Problemas previos que usted o los [employer] de su familia hayan tenido con el uso de anestsicos.  Todo problema mdico o gentico que usted o los miembros de su familia hayan sufrido.  Enfermedades de [employer].  Cirugas previas.  Embarazos previos.  Padecimientos mdicos.  Excesos en el consumo de alcohol o de tabaco.  Historia de consumo de drogas. RIESGOS Y COMPLICACIONES  Generalmente, el procedimiento de FIV es un procedimiento seguro. Sin embargo, Games developer procedimiento, pueden surgir complicaciones. Las complicaciones posibles son:  Almon Hercules o  infeccin.  Problemas con la anestesia.  Cogulos sanguneos.  El procedimiento no tiene xito.  Tener gemelos o embarazos mltiples  Aumento del riesgo de [place]. ANTES DEL PROCEDIMIENTO  Antes de comenzar el ciclo de FIV, usted y el donante sern sometidos a estudios para asegurarse de que la FIV es la mejor opcin. Algunas personas pueden no beneficiarse con este tipo de tecnologa para la asistencia reproductiva.   Usted y Transport planner tendrn que proporcionar una historia clnica completa y la historia clnica de sus familiares.  Usted y Transport planner sern sometidos a un examen fsico.  Usted y el donante podrn necesitar realizarse anlisis de sangre para detectar enfermedades infecciosas, incluyendo el VIH.  Podrn solicitarle otros estudios, por ejemplo:  Estudio de los ovarios para determinar la calidad y la cantidad de vulos.  Estudios hormonales y de ovulacin.  Un examen del tero. Este se realiza [employer] de un tipo de Magazine features editor, despus de Tour manager un lquido en el tero a travs del cuello uterino (ecohisterografa o utilizando un tubo delgado y flexible, con Ardelia Mems pequea luz y cmara en un extremo (histeroscopio).  La esperma del donante ser tomada y [place] para ver si es normal, hay cantidad suficiente para fertilizar el vulo y que actan normalmente despus de las relaciones sexuales (examen postcoital ). PROCEDIMIENTO  La FIV consiste en varios pasos. Estos procedimientos pueden Associate Professor mdico o en una clnica. Un ciclo de FIV puede llevar alrededor de 2 semanas, y podr requerirse ms de un ciclo. Los pasos de la FIV son:  IT consultant. Si se utilizan sus vulos durante la FIV, al comienzo del ciclo  Un ciclo de FIV puede llevar alrededor de 2 semanas, y podrá requerirse más de un ciclo. Los pasos de la FIV son:  · Estimulación ovárica. Si se utilizan sus óvulos durante la FIV, al comienzo del ciclo comenzará un tratamiento con hormonas artificiales (sintéticas. Estas hormonas estimulan los ovarios para producir múltiples óvulos, a diferencia del óvulo único que normalmente se desarrolla cada mes. Es necesario obtener  múltiples óvulos debido a que algunos de ellos no se fertilizarán o no se desarrollarán normalmente después de la fertilización.  · Extracción de óvulos. Utilizando imágenes radiográficas como guía, el medico insertará una aguja fina a través de la vagina y la dirigirá a los ovarios y sacos (folículos) que contienen los óvulos La aguja se conecta a un dispositivo de succión, que retira los óvulos y el líquido de cada folículo, uno por vez. El procedimiento se repite para el otro ovario.  · Inseminación y fertilización. El esperma se une a los ovarios (inseminación) y se almacena en una cámara que tiene un ambiente controlado. Generalmente el esperma ingresa (fertiliza) el óvulo unas horas después de la inseminación.  · Transferencia embrionaria. El médico colocará los embriones en su útero usando un tubo delgado (catéter) que contiene los embriones. El catéter se inserta en la vagina a través del cuello uterino, y de allí al útero. La transferencia de óvulos generalmente ocurre entre 2 a 6 días luego de la extracción de los óvulos. Si hay éxito, el embrión se adherirá (implante) en la superficie interna del útero alrededor de 6 a 10 días luego de la extracción de los óvulos. Si se implanta el embrión en la membrana que cubre el útero y se desarrolla, resulta en un embarazo.  DESPUÉS DEL PROCEDIMIENTO   · Tendrá que permanecer acostada durante una hora o más, antes de volver a su casa.  · Será necesario que siga con la terapia hormonal por 3 meses aproximadamente, o según lo que le indique su médico.  · Puede retomar su dieta y las actividades habituales.  Document Released: 11/06/2012  ExitCare® Patient Information ©2014 [employer] LLC.

## 2013-07-15 NOTE — Progress Notes (Signed)
   The patient presents to the office for followup on her secondary infertility please see note dated every second 2015 for additional detail.  Patient with past history of oligomenorrhea. Ovulation induction medication previously attempted as follows:  First cycle with clomiphene citrate 100 mg per day 5 through 9 unsuccessful  Patient went for 4 cycles with Femera 2.5 mg for days 3-7 with timing of intercourse with ovulation predictor kit no success despite positive response  The patient has had serum progesterone level which have indicated that she has responded to medication. She's had a previous normal HSG but husband's semen analysis but decreased motility secondary to viscosity.  On March 19 she underwent a sperm washing and intrauterine insemination after a positive change in her ovulation predictor kit. A sperm washing results were as follows:  Pre-wash: Volume 3.5  Motility 43.5%  Post wash  Volume 0.78  Motility 61.5%  Patient presented to the office today to go through 1 more cycle of sperm washing and intrauterine insemination. We will re- stimulated with Femara 2.5 mg from day 3-7 of the cycle. She will continue with her metformin 500 mg 1 by mouth twice a day since she is prediabetic and overweight. She will also use the dexamethasone 0.5 mg from day 2-18 to help with her ovulation. If she is not successful with the cycle we had discussed at the next step would be referral to reproductive endocrinologist for consideration of in vitro fertilization since she is now 33 years of age. All the above instructions were provided in Spanish. Patient of potential risk of ovulation induction medication to include hyperstimulation and multiple gestation.

## 2013-07-25 ENCOUNTER — Ambulatory Visit (INDEPENDENT_AMBULATORY_CARE_PROVIDER_SITE_OTHER): Payer: Self-pay | Admitting: Gynecology

## 2013-07-25 ENCOUNTER — Encounter: Payer: Self-pay | Admitting: Gynecology

## 2013-07-25 ENCOUNTER — Telehealth: Payer: Self-pay | Admitting: *Deleted

## 2013-07-25 VITALS — BP 114/76

## 2013-07-25 DIAGNOSIS — Z3189 Encounter for other procreative management: Secondary | ICD-10-CM

## 2013-07-25 DIAGNOSIS — N970 Female infertility associated with anovulation: Secondary | ICD-10-CM

## 2013-07-25 NOTE — Progress Notes (Signed)
   Patient presented to the office today for intrauterine insemination after sperm washing as a result of secondary infertility (oligomenorrhea, decrease sperm motility). Past treatments have included the following:  Ovulation induction medication previously attempted as follows:  First cycle with clomiphene citrate 100 mg per day 5 through 9 unsuccessful  Patient went for 4 cycles with Femera 2.5 mg for days 3-7 with timing of intercourse with ovulation predictor kit no success despite positive response  The patient has had serum progesterone level which have indicated that she has responded to medication. She's had a previous normal HSG but husband's semen analysis but decreased motility secondary to viscosity.  On April 28 of last cycle and had the same protocol used above along with sperm washing and insemination.  Pre-wash: Volume 3.5  Motility 43.5%  Post wash  Volume 0.78  Motility 61.5%  The patient wanted to try one more time with intrauterine insemination after sperm washing before being referred to reproductive endocrinologist for consideration of in vitro fertilization. She took her Femera 2.5 mg from day 3 through 7 with cycle. She continued with the metformin 500 mg 1 by mouth twice a day since she was P. diabetic and overweight. She was started on dexamethasone 0.5 mg from day 2-18 to help with her ovulation. Today is day 12 of her cycle and she noted a positive change in the ovulation predictor kit.  Sperm washing today demonstrated motility was 56% and improved to 72% after washing. Sperm count 83 million sperm volume was 3.5 cc  The patient was inseminated with husband sperm after sperm washing as noted above with a sterile catheter and the sperm was deposited into the intrauterine cavity. She rested for 10 minutes before being released home. We'll await to see if she is conceived with a cycle. Of note she had clear mucous noted at the cervix with good spinberkeit.

## 2013-07-25 NOTE — Telephone Encounter (Signed)
Pt called with +OPK will do IUI today. Sperm washing At Dr Waldon Reining office at 1pm then will come to office for IUI. All informed by [place] in [place]. KW CMA

## 2013-07-28 ENCOUNTER — Encounter: Payer: Self-pay | Admitting: Gynecology

## 2013-10-07 ENCOUNTER — Ambulatory Visit: Payer: Self-pay | Admitting: Gynecology

## 2014-01-19 ENCOUNTER — Encounter: Payer: Self-pay | Admitting: Gynecology

## 2014-03-30 ENCOUNTER — Other Ambulatory Visit (HOSPITAL_COMMUNITY)
Admission: RE | Admit: 2014-03-30 | Discharge: 2014-03-30 | Disposition: A | Discharge status: Home / Self Care | Payer: BLUE CROSS/BLUE SHIELD | Source: Ambulatory Visit | Attending: Gynecology | Admitting: Gynecology

## 2014-03-30 ENCOUNTER — Encounter: Payer: Self-pay | Admitting: Gynecology

## 2014-03-30 ENCOUNTER — Ambulatory Visit (INDEPENDENT_AMBULATORY_CARE_PROVIDER_SITE_OTHER): Payer: BLUE CROSS/BLUE SHIELD | Admitting: Gynecology

## 2014-03-30 VITALS — BP 128/84 | Ht 62.0 in | Wt 165.0 lb

## 2014-03-30 DIAGNOSIS — Z1151 Encounter for screening for human papillomavirus (HPV): Secondary | ICD-10-CM | POA: Diagnosis present

## 2014-03-30 DIAGNOSIS — Z01419 Encounter for gynecological examination (general) (routine) without abnormal findings: Secondary | ICD-10-CM | POA: Diagnosis present

## 2014-03-30 LAB — COMPREHENSIVE METABOLIC PANEL
ALBUMIN: 3.9 g/dL (ref 3.5–5.2)
ALT: 12 U/L (ref 0–35)
AST: 14 U/L (ref 0–37)
Alkaline Phosphatase: 76 U/L (ref 39–117)
BUN: 10 mg/dL (ref 6–23)
CHLORIDE: 105 meq/L (ref 96–112)
CO2: 28 mEq/L (ref 19–32)
CREATININE: 0.67 mg/dL (ref 0.50–1.10)
Calcium: 9.2 mg/dL (ref 8.4–10.5)
GLUCOSE: 100 mg/dL — AB (ref 70–99)
Potassium: 3.9 mEq/L (ref 3.5–5.3)
SODIUM: 138 meq/L (ref 135–145)
TOTAL PROTEIN: 6.7 g/dL (ref 6.0–8.3)
Total Bilirubin: 0.5 mg/dL (ref 0.2–1.2)

## 2014-03-30 LAB — CBC WITH DIFFERENTIAL/PLATELET
BASOS PCT: 1 % (ref 0–1)
Basophils Absolute: 0.1 10*3/uL (ref 0.0–0.1)
EOS PCT: 2 % (ref 0–5)
Eosinophils Absolute: 0.1 10*3/uL (ref 0.0–0.7)
HCT: 39 % (ref 36.0–46.0)
Hemoglobin: 12.7 g/dL (ref 12.0–15.0)
Lymphocytes Relative: 37 % (ref 12–46)
Lymphs Abs: 2.6 10*3/uL (ref 0.7–4.0)
MCH: 28.1 pg (ref 26.0–34.0)
MCHC: 32.6 g/dL (ref 30.0–36.0)
MCV: 86.3 fL (ref 78.0–100.0)
MONOS PCT: 6 % (ref 3–12)
MPV: 9.4 fL (ref 8.6–12.4)
Monocytes Absolute: 0.4 10*3/uL (ref 0.1–1.0)
NEUTROS ABS: 3.8 10*3/uL (ref 1.7–7.7)
Neutrophils Relative %: 54 % (ref 43–77)
Platelets: 260 10*3/uL (ref 150–400)
RBC: 4.52 MIL/uL (ref 3.87–5.11)
RDW: 14.2 % (ref 11.5–15.5)
WBC: 7.1 10*3/uL (ref 4.0–10.5)

## 2014-03-30 LAB — LIPID PANEL
Cholesterol: 175 mg/dL (ref 0–200)
HDL: 44 mg/dL (ref >39)
LDL Cholesterol: 81 mg/dL (ref 0–99)
Total CHOL/HDL Ratio: 4 Ratio
Triglycerides: 248 mg/dL — ABNORMAL HIGH (ref <150)
VLDL: 50 mg/dL — ABNORMAL HIGH (ref 0–40)

## 2014-03-30 NOTE — Addendum Note (Signed)
Addended by: Thurnell Garbe A on: 03/30/2014 03:26 PM   Modules accepted: Orders, SmartSet

## 2014-03-30 NOTE — Progress Notes (Signed)
Dafina Suk 03/29/1980 [id number]   History:    34 y.o.  gravida 2 para 2 who presented to the office today for her annual gynecological exam. Patient was asymptomatic. Patient now reports normal menstrual cycles and no longer interested in getting pregnant. Last year she had been treated for oligomenorrhea and decreased female sperm motility and did not conceive after 5 cycles of ovulation induction medication. Patient with no previous history of any abnormal Pap smear. Her PCP in [facility] several years ago had done her Pap smear we have no documentation. Patient's mother had history of non-insulin-dependent diabetes.   Past medical history,surgical history, family history and social history were all reviewed and documented in the EPIC chart.  Gynecologic History Patient's last menstrual period was 03/16/2014. Contraception: none Last Pap: Several years ago. Results were: normal Last mammogram: Not indicated. Results were: Not indicated  Obstetric History OB History  Gravida Para Term Preterm AB SAB TAB Ectopic Multiple Living  2 2        2     # Outcome Date GA Lbr Len/2nd Weight Sex Delivery Anes PTL Lv  2 Para           1 Para                ROS: A ROS was performed and pertinent positives and negatives are included in the history.  GENERAL: No fevers or chills. HEENT: No change in vision, no earache, sore throat or sinus congestion. NECK: No pain or stiffness. CARDIOVASCULAR: No chest pain or pressure. No palpitations. PULMONARY: No shortness of breath, cough or wheeze. GASTROINTESTINAL: No abdominal pain, nausea, vomiting or diarrhea, melena or bright red blood per rectum. GENITOURINARY: No urinary frequency, urgency, hesitancy or dysuria. MUSCULOSKELETAL: No joint or muscle pain, no back pain, no recent trauma. DERMATOLOGIC: No rash, no itching, no lesions. ENDOCRINE: No polyuria, polydipsia, no heat or cold intolerance. No recent change in weight.  HEMATOLOGICAL: No anemia or easy bruising or bleeding. NEUROLOGIC: No headache, seizures, numbness, tingling or weakness. PSYCHIATRIC: No depression, no loss of interest in normal activity or change in sleep pattern.     Exam: chaperone present  BP 128/84 mmHg  Ht 5\' 2"  (1.575 m)  Wt 165 lb (74.844 kg)  BMI 30.17 kg/m2  LMP 03/16/2014  Body mass index is 30.17 kg/(m^2).  General appearance : Well developed well nourished female. No acute distress HEENT: Neck supple, trachea midline, no carotid bruits, no thyroidmegaly Lungs: Clear to auscultation, no rhonchi or wheezes, or rib retractions  Heart: Regular rate and rhythm, no murmurs or gallops Breast:Examined in sitting and supine position were symmetrical in appearance, no palpable masses or tenderness,  no skin retraction, no nipple inversion, no nipple discharge, no skin discoloration, no axillary or supraclavicular lymphadenopathy Abdomen: no palpable masses or tenderness, no rebound or guarding Extremities: no edema or skin discoloration or tenderness  Pelvic:  Bartholin, Urethra, Skene Glands: Within normal limits             Vagina: No gross lesions or discharge  Cervix: No gross lesions or discharge  Uterus  anteverted, normal size, shape and consistency, non-tender and mobile  Adnexa  Without masses or tenderness  Anus and perineum  normal   Rectovaginal  normal sphincter tone without palpated masses or tenderness             Hemoccult not indicated     Assessment/Plan:  34 y.o. female for annual exam who is only complaint  was occasional vaginal dryness during intercourse. She was instructed to purchase over-the-counter Replens to use when necessary. The following labs were ordered today: CBC, comprehensive metabolic panel, fasting lipid profile, TSH and urinalysis. Since she is not using any form of contraception it was encouraged that she take 1 prenatal vitamin daily.  Terrance Mass MD, 11:11 AM 03/30/2014

## 2014-03-30 NOTE — Addendum Note (Signed)
Addended by: Thurnell Garbe A on: 03/30/2014 04:20 PM   Modules accepted: Orders, SmartSet

## 2014-03-31 ENCOUNTER — Other Ambulatory Visit: Payer: Self-pay | Admitting: Gynecology

## 2014-03-31 DIAGNOSIS — E7800 Pure hypercholesterolemia, unspecified: Secondary | ICD-10-CM

## 2014-03-31 LAB — URINALYSIS W MICROSCOPIC + REFLEX CULTURE
BILIRUBIN URINE: NEGATIVE
CRYSTALS: NONE SEEN
Casts: NONE SEEN
Glucose, UA: NEGATIVE mg/dL
Hgb urine dipstick: NEGATIVE
Ketones, ur: NEGATIVE mg/dL
Leukocytes, UA: NEGATIVE
Nitrite: NEGATIVE
PH: 5 (ref 5.0–8.0)
Protein, ur: NEGATIVE mg/dL
SPECIFIC GRAVITY, URINE: 1.016 (ref 1.005–1.030)
Urobilinogen, UA: 0.2 mg/dL (ref 0.0–1.0)

## 2014-03-31 LAB — CYTOLOGY - PAP

## 2014-04-02 LAB — URINE CULTURE: Colony Count: >=100000

## 2014-04-20 ENCOUNTER — Other Ambulatory Visit: Payer: Self-pay | Admitting: Gynecology

## 2014-04-20 MED ORDER — NITROFURANTOIN MONOHYD MACRO 100 MG PO CAPS: 100.0000 mg | ORAL_CAPSULE | Freq: Two times a day (BID) | ORAL | Status: DC

## 2014-12-31 ENCOUNTER — Ambulatory Visit: Payer: BLUE CROSS/BLUE SHIELD | Admitting: Gynecology

## 2015-03-03 ENCOUNTER — Ambulatory Visit: Payer: BLUE CROSS/BLUE SHIELD | Admitting: Gynecology

## 2015-04-02 ENCOUNTER — Encounter: Payer: BLUE CROSS/BLUE SHIELD | Admitting: Gynecology

## 2015-06-08 IMAGING — RF DG HYSTEROGRAM
5 series · 10 of 10 positions shown · IV contrast (omnipaque)
Comparison: none

CLINICAL DATA: Infertility.

HYSTEROSALPINGOGRAM
TECHNIQUE: Following cleansing of the cervix and vagina with
Betadine solution, a hysterosalpingogram was performed using a 5-
French hysterosalpingogram catheter and Omnipaque 300 contrast.
The patient tolerated the examination without difficulty.
Fluoroscopy time: 42-second

[Series 1: run · 2 of 2 slices shown (1 of 5)]
[im 1/2]
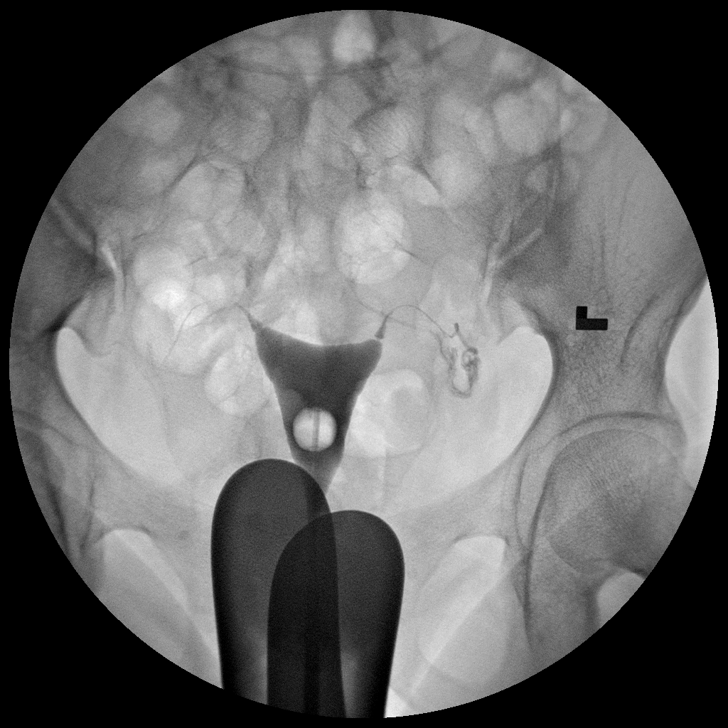
[im 2/2]
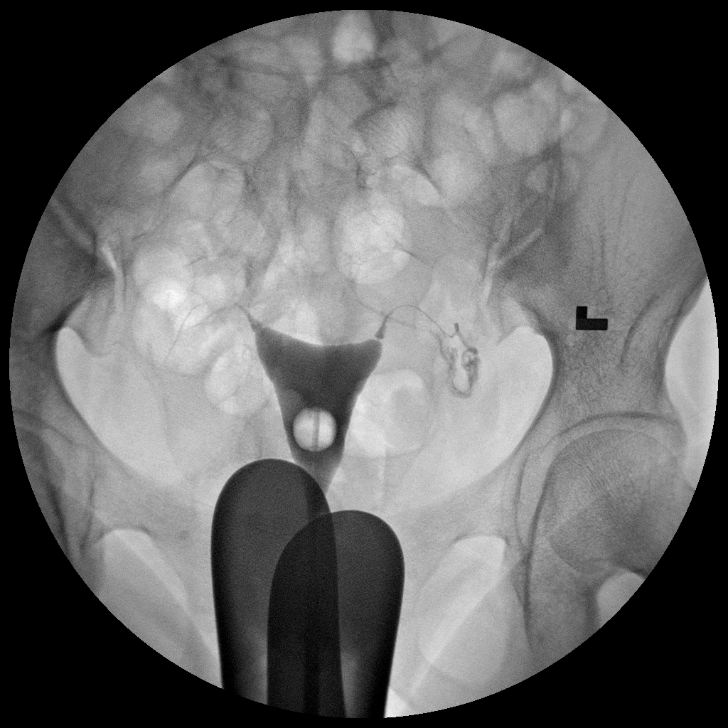

[Series 2: run · 2 of 2 slices shown (2 of 5)]
[im 1/2]
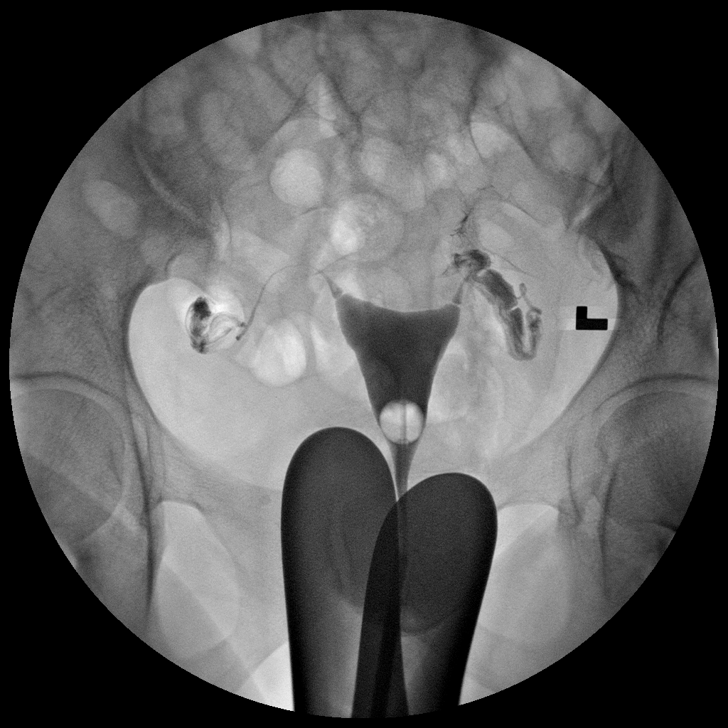
[im 2/2]
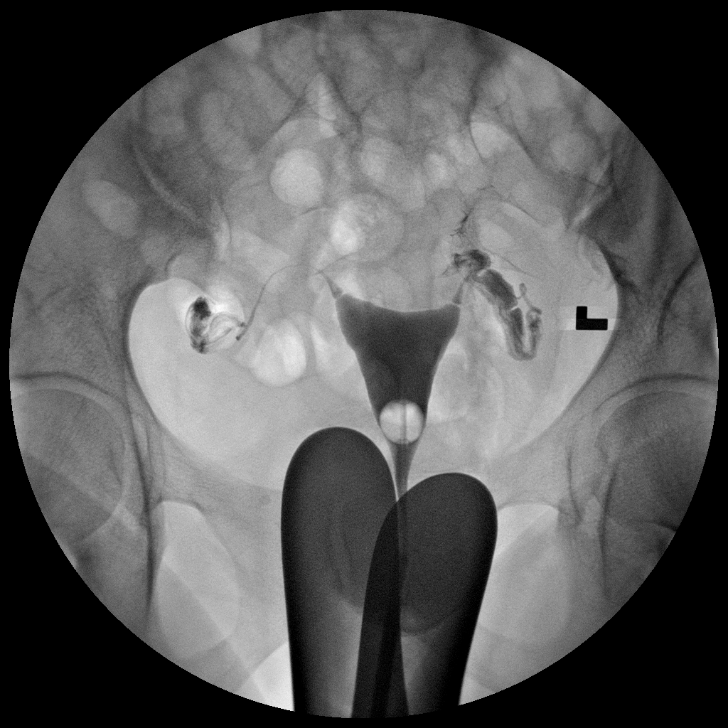

[Series 3: run · 2 of 2 slices shown (3 of 5)]
[im 1/2]
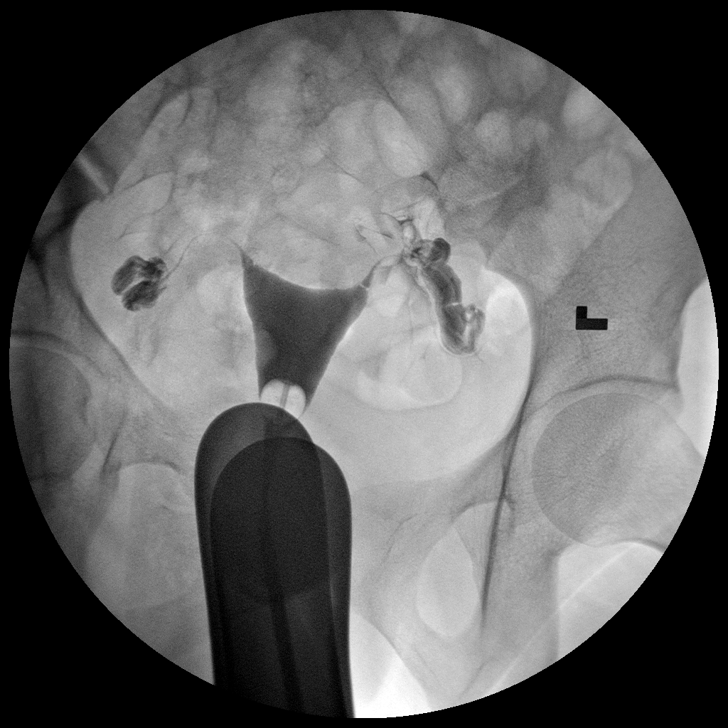
[im 2/2]
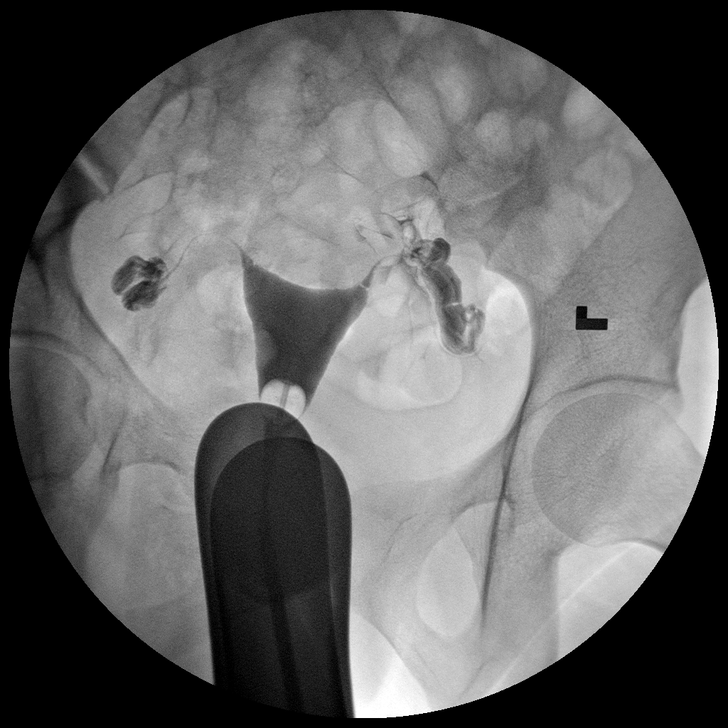

[Series 4: run · 2 of 2 slices shown (4 of 5)]
[im 1/2]
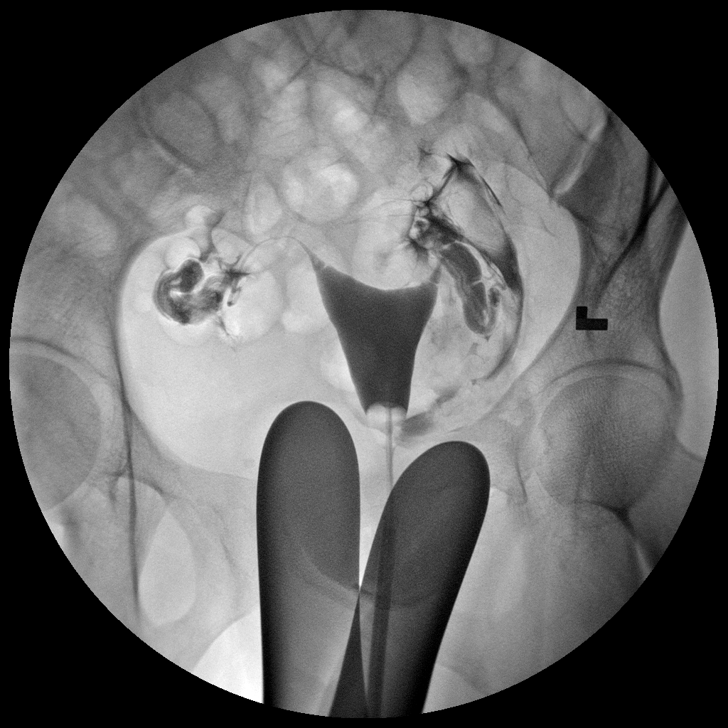
[im 2/2]
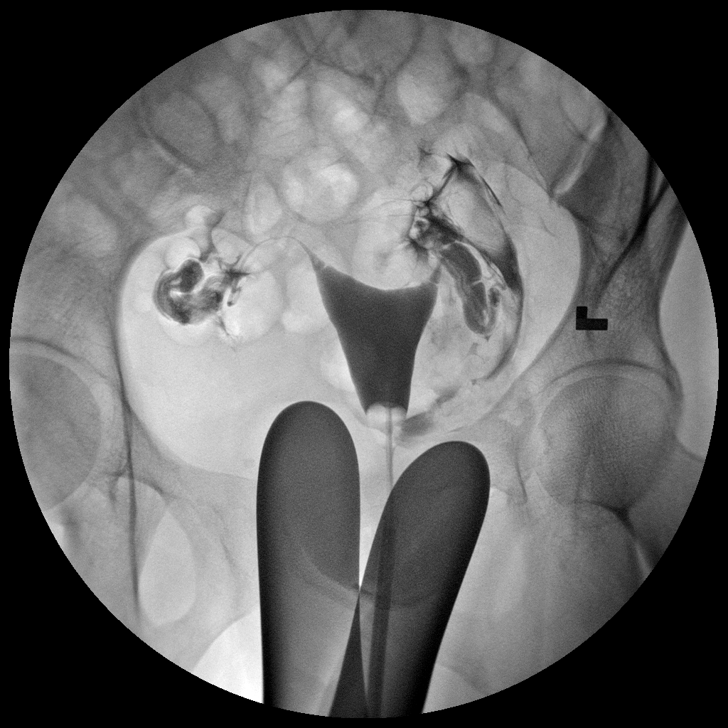

[Series 5: run · 2 of 2 slices shown (5 of 5)]
[im 1/2]
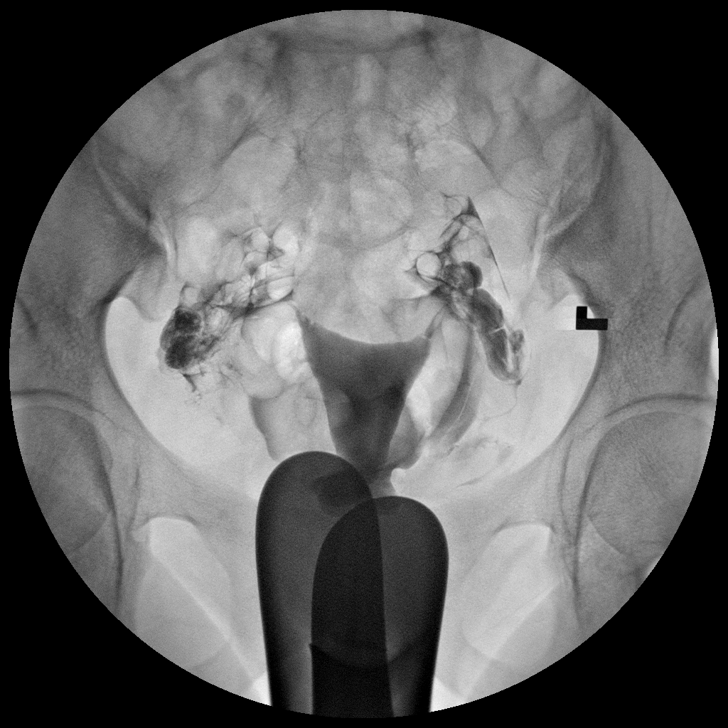
[im 2/2]
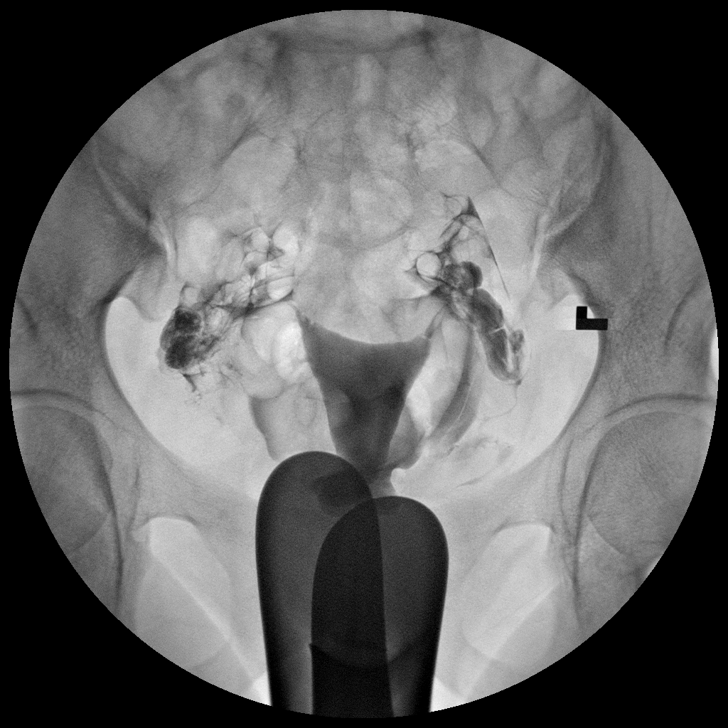

[10 of 10 positions shown; findings below may reference images not displayed]

FINDINGS: A normal endometrial morphology is seen.  Both fallopian
tubes demonstrate a normal morphology and bilateral free
intraperitoneal spill is noted.  No evidence for loculation of
contrast is seen within the pelvis to suggest the presence of
peritubal or periovarian adhesions.
IMPRESSION: Normal HSG

## 2016-06-26 ENCOUNTER — Encounter: Payer: Self-pay | Admitting: Gynecology

## 2016-06-26 ENCOUNTER — Ambulatory Visit (INDEPENDENT_AMBULATORY_CARE_PROVIDER_SITE_OTHER): Payer: BLUE CROSS/BLUE SHIELD | Admitting: Gynecology

## 2016-06-26 VITALS — BP 126/78 | Ht 62.0 in | Wt 178.6 lb

## 2016-06-26 DIAGNOSIS — Z01411 Encounter for gynecological examination (general) (routine) with abnormal findings: Secondary | ICD-10-CM

## 2016-06-26 DIAGNOSIS — N926 Irregular menstruation, unspecified: Secondary | ICD-10-CM

## 2016-06-26 DIAGNOSIS — N898 Other specified noninflammatory disorders of vagina: Secondary | ICD-10-CM

## 2016-06-26 DIAGNOSIS — Z833 Family history of diabetes mellitus: Secondary | ICD-10-CM

## 2016-06-26 DIAGNOSIS — R140 Abdominal distension (gaseous): Secondary | ICD-10-CM

## 2016-06-26 LAB — COMPREHENSIVE METABOLIC PANEL
ALT: 22 U/L (ref 6–29)
AST: 19 U/L (ref 10–30)
Albumin: 4 g/dL (ref 3.6–5.1)
Alkaline Phosphatase: 75 U/L (ref 33–115)
BILIRUBIN TOTAL: 0.3 mg/dL (ref 0.2–1.2)
BUN: 10 mg/dL (ref 7–25)
CALCIUM: 8.8 mg/dL (ref 8.6–10.2)
CO2: 25 mmol/L (ref 20–31)
Chloride: 106 mmol/L (ref 98–110)
Creat: 0.66 mg/dL (ref 0.50–1.10)
Glucose, Bld: 86 mg/dL (ref 65–99)
Potassium: 3.9 mmol/L (ref 3.5–5.3)
Sodium: 137 mmol/L (ref 135–146)
TOTAL PROTEIN: 6.9 g/dL (ref 6.1–8.1)

## 2016-06-26 LAB — WET PREP FOR TRICH, YEAST, CLUE
Clue Cells Wet Prep HPF POC: NONE SEEN
TRICH WET PREP: NONE SEEN
Yeast Wet Prep HPF POC: NONE SEEN

## 2016-06-26 LAB — PREGNANCY, URINE: Preg Test, Ur: NEGATIVE

## 2016-06-26 MED ORDER — METRONIDAZOLE 500 MG PO TABS: 500.0000 mg | ORAL_TABLET | Freq: Two times a day (BID) | ORAL | 0 refills | Status: DC

## 2016-06-26 NOTE — Patient Instructions (Signed)
Vaginosis bacteriana (Bacterial Vaginosis) La vaginosis bacteriana es una infeccin de la vagina. Se produce cuando crece una cantidad excesiva de grmenes normales (bacterias sanas) en la vagina. Esta infeccin aumenta el riesgo de contraer otras infecciones de transmisin sexual. El tratamiento de esta infeccin puede ayudar a reducir el riesgo de otras infecciones, como:  Clamidia.  Gonorrea.  VIH.  Herpes. CUIDADOS EN EL HOGAR  Tome los medicamentos tal como se lo indic su mdico.  Finalice la prescripcin completa, aunque comience a sentirse mejor.  Comunique a sus compaeros sexuales que sufre una infeccin. Deben consultar a su mdico para iniciar un tratamiento.  Durante el tratamiento: ? Evite mantener relaciones sexuales o use preservativos de la forma correcta. ? No se haga duchas vaginales. ? No consuma alcohol a menos que el mdico lo autorice. ? No amamante a menos que el mdico la autorice.  SOLICITE AYUDA SI:  No mejora luego de 3 das de tratamiento.  Observa una secrecin (prdida) de color gris ms abundante que proviene de la vagina.  Siente ms dolor que antes.  Tiene fiebre.  ASEGRESE DE QUE:  Comprende estas instrucciones.  Controlar su afeccin.  Recibir ayuda de inmediato si no mejora o si empeora.  Esta informacin no tiene como fin reemplazar el consejo del mdico. Asegrese de hacerle al mdico cualquier pregunta que tenga. Document Released: 06/02/2008 Document Revised: 06/28/2015 Document Reviewed: 10/16/2012 Elsevier Interactive Patient Education  2017 Elsevier Inc.  

## 2016-06-26 NOTE — Progress Notes (Signed)
Ruth Rogers Jan 24, 1981 [id number]   History:    36 y.o.  for annual gyn exam with a complaint of vaginal discharge with a followed her and some pruritus for the past few days and also abdominal bloating. bloating. She attributes to bloating shortly after meals. She denies any dysuria or frequency or any other GI complaints. She has completed times on office headaches and nausea and dizziness for the past several weeks. She does have a strong family history of diabetes. Review of her record indicated that she was weighing 165 pounds last year and is up to 170 pounds now. Patient with no past history of any abnormal Pap smears. Patient in the past that suffer from oligomenorrhea and had been placed and ovulation induction medication but is no longer on it. She does have 2 children.  Past medical history,surgical history, family history and social history were all reviewed and documented in the EPIC chart.  Gynecologic History Patient's last menstrual period was 06/02/2016 (exact date). Contraception: none Last Pap: 2016. Results were: normal Last mammogram: Not indicated. Results were: normal  Obstetric History OB History  Gravida Para Term Preterm AB Living  2 2       2   SAB TAB Ectopic Multiple Live Births               # Outcome Date GA Lbr Len/2nd Weight Sex Delivery Anes PTL Lv  2 Para           1 Para                ROS: A ROS was performed and pertinent positives and negatives are included in the history.  GENERAL: No fevers or chills. HEENT: No change in vision, no earache, sore throat or sinus congestion. NECK: No pain or stiffness. CARDIOVASCULAR: No chest pain or pressure. No palpitations. PULMONARY: No shortness of breath, cough or wheeze. GASTROINTESTINAL: No abdominal pain, nausea, vomiting or diarrhea, melena or bright red blood per rectum. GENITOURINARY: No urinary frequency, urgency, hesitancy or dysuria. MUSCULOSKELETAL: No joint or muscle pain, no back pain,  no recent trauma. DERMATOLOGIC: No rash, no itching, no lesions. ENDOCRINE: No polyuria, polydipsia, no heat or cold intolerance. No recent change in weight. HEMATOLOGICAL: No anemia or easy bruising or bleeding. NEUROLOGIC: No headache, seizures, numbness, tingling or weakness. PSYCHIATRIC: No depression, no loss of interest in normal activity or change in sleep pattern.     Exam: chaperone present  BP 126/78   Ht 5\' 2"  (1.575 m)   Wt 178 lb 9.6 oz (81 kg)   LMP 06/02/2016 (Exact Date)   BMI 32.67 kg/m   Body mass index is 32.67 kg/m.  General appearance : Well developed well nourished female. No acute distress HEENT: Eyes: no retinal hemorrhage or exudates,  Neck supple, trachea midline, no carotid bruits, no thyroidmegaly Lungs: Clear to auscultation, no rhonchi or wheezes, or rib retractions  Heart: Regular rate and rhythm, no murmurs or gallops Breast:Examined in sitting and supine position were symmetrical in appearance, no palpable masses or tenderness,  no skin retraction, no nipple inversion, no nipple discharge, no skin discoloration, no axillary or supraclavicular lymphadenopathy Abdomen: no palpable masses or tenderness, no rebound or guarding Extremities: no edema or skin discoloration or tenderness  Pelvic:  Bartholin, Urethra, Skene Glands: Within normal limits             Vagina: No gross lesions or discharge  Cervix: No gross lesions or discharge  Uterus  anteverted, normal  size, shape and consistency, non-tender and mobile  Adnexa limited due to some tenderness in left lower quadrant  Anus and perineum  normal   Rectovaginal  normal sphincter tone without palpated masses or tenderness             Hemoccult not indicated   Wet prep moderate bacteria few white blood cells  Urine pregnancy test negative   Assessment/Plan:  36 y.o. female for annual exam will be treated in the event of a mild bacterial vaginosis with Flagyl 500 mg one by mouth twice a day for 7  days. She'll return back to the office next week for more thorough evaluation of her adnexa by doing a pelvic ultrasound tissues specially tender during exam under left lower quadrant. If her ultrasound is normal to the symptoms of bloating after meals will be referred to the gastrologist for further evaluation. We will check today comprehensive metabolic panel along with a hemoglobin A1c. Pap smear not indicated this year.  An additional 10 minutes was spent counseling quantity care of this patient with bacterial vaginosis, abdominal bloating, headache nausea and dizziness.   Terrance Mass MD, 3:18 PM 06/26/2016

## 2016-06-27 ENCOUNTER — Encounter: Payer: BLUE CROSS/BLUE SHIELD | Admitting: Gynecology

## 2016-06-27 LAB — HEMOGLOBIN A1C
HEMOGLOBIN A1C: 6.2 % — AB (ref <5.7)
Mean Plasma Glucose: 131 mg/dL

## 2016-07-05 ENCOUNTER — Encounter: Payer: Self-pay | Admitting: Gynecology

## 2016-07-05 ENCOUNTER — Ambulatory Visit (INDEPENDENT_AMBULATORY_CARE_PROVIDER_SITE_OTHER): Payer: BLUE CROSS/BLUE SHIELD | Admitting: Gynecology

## 2016-07-05 ENCOUNTER — Ambulatory Visit (INDEPENDENT_AMBULATORY_CARE_PROVIDER_SITE_OTHER): Payer: BLUE CROSS/BLUE SHIELD

## 2016-07-05 ENCOUNTER — Other Ambulatory Visit: Payer: Self-pay | Admitting: Gynecology

## 2016-07-05 DIAGNOSIS — D251 Intramural leiomyoma of uterus: Secondary | ICD-10-CM

## 2016-07-05 DIAGNOSIS — R7303 Prediabetes: Secondary | ICD-10-CM | POA: Diagnosis not present

## 2016-07-05 DIAGNOSIS — N831 Corpus luteum cyst of ovary, unspecified side: Secondary | ICD-10-CM | POA: Diagnosis not present

## 2016-07-05 DIAGNOSIS — N926 Irregular menstruation, unspecified: Secondary | ICD-10-CM

## 2016-07-05 DIAGNOSIS — E782 Mixed hyperlipidemia: Secondary | ICD-10-CM

## 2016-07-05 DIAGNOSIS — R140 Abdominal distension (gaseous): Secondary | ICD-10-CM

## 2016-07-05 DIAGNOSIS — Z833 Family history of diabetes mellitus: Secondary | ICD-10-CM

## 2016-07-05 LAB — LIPID PANEL
CHOLESTEROL: 198 mg/dL (ref <200)
HDL: 46 mg/dL — ABNORMAL LOW (ref >50)
LDL Cholesterol: 112 mg/dL — ABNORMAL HIGH (ref <100)
Total CHOL/HDL Ratio: 4.3 Ratio (ref <5.0)
Triglycerides: 199 mg/dL — ABNORMAL HIGH (ref <150)
VLDL: 40 mg/dL — ABNORMAL HIGH (ref <30)

## 2016-07-05 NOTE — Progress Notes (Signed)
   Patient is a 36 year old that presented to the office today to discuss her ultrasound. Patient was seen in the office on April 9 for her annual exam and complained of abdominal bloating and most of timed been shortly after meals. 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   Review of her record indicated that her comprehensive metabolic panel on April 9 was normal she had not had a fasting lipid profile since 2016 and it was brought to my attention that her triglycerides were elevated at 248 and revealed the elbow was elevated at 50. Patient does have strong family history of diabetes whereby her mother is on hypoglycemic agent. Patient herself with both her pregnancies had gestational diabetes. We had done a hemoglobin A1c which is found to be elevated at 6.2 and her serum glucose was 86.  Ultrasound: Been done because on and off left lower cord discomfort as well as bloating at times. Her ultrasound demonstrated the uterus measured 9.7 x 8.0 x 7.4 cm with endometrial stripe of 13.6 mm patient the process of starting her menses. She had 1 small intramural myoma measuring 3.9 x 2.5 x 3.5 cm. The right corpus luteum cyst measuring 20 x 18 mm with positive color flow the periphery was noted. Left ovary was normal. No fluid in the cul-de-sac. No apparent adnexal masses on the left.  Assessment/plan: #1 patient was small insignificant intramural myoma and right corpus luteum cyst #2 because of patient's abdominal bloating especially after meals she will be referred to the gastroenterologist for further evaluation #3 patient with strong family history of diabetes and personal history of gestational diabetes 2 and elevated hemoglobin A1c will be placed on a diabetic diet and asked to return back in 3 months for repeat fasting blood sugar. #4 as a result of her 2016 elevated triglycerides and VLDL  we will repeat her fasting lipid profile today. We discussed importance of exercise 1 hour at least 3 times a week and handout on diabetic diet provided in Spanish as well as cholesterol lowering diet.

## 2016-07-05 NOTE — Patient Instructions (Addendum)
La diabetes mellitus y los alimentos (Diabetes Mellitus and Food) Es importante que controle su nivel de azcar en la sangre (glucosa). El nivel de glucosa en sangre depende en gran medida de lo que usted come. Comer alimentos saludables en las cantidades [place] a lo largo del Training and development officer, aproximadamente a la misma hora [employer], lo ayudar a Chief Technology Officer su nivel de Multimedia programmer. Tambin puede ayudarlo a retrasar o Patent attorney de la diabetes mellitus. Comer de [employer] saludable incluso puede ayudarlo a Chartered loss adjuster de presin arterial y a Science writer o Theatre manager un peso saludable. Entre las recomendaciones generales para alimentarse y Audiological scientist los alimentos de forma saludable, se incluyen las siguientes:  Respetar las comidas principales y comer colaciones con regularidad. Evitar pasar largos perodos sin comer con el fin de perder peso.  Seguir una dieta que consista principalmente en alimentos de origen vegetal, como frutas, vegetales, frutos secos, legumbres y cereales integrales.  Utilizar mtodos de coccin a baja temperatura, como hornear, en lugar de mtodos de coccin a alta temperatura, como frer en abundante aceite. Trabaje con el nutricionista para aprender a Financial planner nutricional de las etiquetas de los alimentos. CMO PUEDEN AFECTARME LOS ALIMENTOS? Carbohidratos Los carbohidratos afectan el nivel de glucosa en sangre ms que cualquier otro tipo de alimento. El nutricionista lo ayudar a Teacher, adult education cuntos carbohidratos puede consumir en cada comida y ensearle a contarlos. El recuento de carbohidratos es importante para mantener la glucosa en sangre en un nivel saludable, en especial si utiliza insulina o toma determinados medicamentos para la diabetes mellitus. Alcohol El alcohol puede provocar disminuciones sbitas de la glucosa en sangre (hipoglucemia), en especial si utiliza insulina o toma determinados medicamentos para la diabetes mellitus. La  hipoglucemia es una afeccin que puede poner en peligro la vida. Los sntomas de la hipoglucemia (somnolencia, mareos y Data processing manager) son similares a los sntomas de haber consumido mucho alcohol. Si el mdico lo autoriza a beber alcohol, hgalo con moderacin y siga estas pautas:  Las mujeres no deben beber ms de un trago por da, y los hombres no deben beber ms de dos tragos por Training and development officer. Un trago es igual a:  12 onzas (355 ml) de cerveza  5 onzas de vino (150 ml) de vino  1,5onzas (23m) de bebidas espirituosas  No beba con el estmago vaco.  Mantngase hidratado. Beba agua, gaseosas dietticas o t helado sin azcar.  Las gaseosas comunes, los jugos y otros refrescos podran contener muchos carbohidratos y se dCivil Service fast streamer QU ALIMENTOS NO SE RECOMIENDAN? Cuando haga las elecciones de alimentos, es importante que recuerde que todos los alimentos son distintos. Algunos tienen menos nutrientes que otros por porcin, aunque podran tener la misma cantidad de caloras o carbohidratos. Es difcil darle al cuerpo lo que necesita cuando consume alimentos con menos nutrientes. Estos son algunos ejemplos de alimentos que debera evitar ya que contienen muchas caloras y carbohidratos, pero pocos nutrientes:  GPhysicist, medicaltrans (la mayora de los alimentos procesados incluyen grasas trans en la etiqueta de Informacin nutricional).  Gaseosas comunes.  Jugos.  Caramelos.  Dulces, como tortas, pasteles, rosquillas y g[place]  Comidas fritas. QU ALIMENTOS PUEDO COMER? Consuma alimentos ricos en nutrientes, que nutrirn el cuerpo y lo mantendrn saludable. Los alimentos que debe comer tambin dependern de varios factores, como:  Las caloras que necesita.  Los medicamentos que toma.  Su peso.  El nivel de glucosa en sElizabeth  El n[place]de presin arterial.  El nivel de colesterol. Debe consumir  una amplia variedad de alimentos, por ejemplo:  Protenas.  Cortes de [employer].  Protenas con bajo contenido de grasas saturadas, como pescado, clara de huevo y frijoles. Evite las carnes procesadas.  Frutas y vegetales.  Frutas y Photographer que pueden ayudar a Chief Technology Officer los niveles sanguneos de [place], como [place], mangos y batatas.  Productos lcteos.  Elija productos lcteos sin grasa o con bajo contenido de [place], como [place], yogur y [place].  Cereales, panes, pastas y arroz.  Elija cereales integrales, como panes multicereales, avena en grano y arroz integral. Estos alimentos pueden ayudar a controlar la presin arterial.  Daphene Jaeger.  Alimentos que contengan grasas saludables, como frutos secos, Musician, aceite de [place], aceite de canola y pescado. TODOS LOS QUE PADECEN DIABETES MELLITUS TIENEN EL [place] PLAN DE [place]? Dado que todas las personas que padecen diabetes mellitus son distintas, no hay un solo plan de comidas que funcione para todos. Es muy importante que se rena con un nutricionista que lo ayudar a crear un plan de comidas adecuado para usted. Esta informacin no tiene Marine scientist el consejo del mdico. Asegrese de hacerle al mdico cualquier pregunta que tenga. Document Released: 06/13/2007 Document Revised: 03/27/2014 Document Reviewed: 01/31/2013 Elsevier Interactive Patient Education  2017 [place] uterinos (Uterine Fibroids) Los fibromas uterinos son masas (tumores) de tejido que pueden desarrollarse en el vientre (tero). Tambin se los [employer]. Este tipo de tumor no es Radio broadcast assistant (benigno) y no se disemina a Airline pilot del cuerpo fuera de la zona plvica, la cual se encuentra entre los huesos de la cadera. En ocasiones, los fibromas pueden crecer en las trompas de [place], en el cuello del tero o en las estructuras de soporte (ligamentos) que rodean el tero. Una mujer puede tener uno o ms fibromas. Los fibromas pueden tener diferente tamao y [place], y crecer en distintas partes del tero.  Algunos pueden crecer hasta volverse bastante grandes. La mayora no requiere tratamiento mdico. CAUSAS Un fibroma puede desarrollarse cuando una nica clula uterina contina creciendo (se multiplica). La mayora de las clulas del cuerpo humano tienen un mecanismo de control que impide que se multipliquen sin control. [place] los sntomas se pueden incluir los siguientes:  Hemorragias intensas durante la menstruacin.  Prdidas de sangre o [employer].  Dolor y opresin en la pelvis.  Problemas de la vejiga, como necesidad de Garment/textile technologist con ms frecuencia (polaquiuria) o necesidad imperiosa de Garment/textile technologist.  Incapacidad para reproducir (infertilidad).  Abortos espontneos. DIAGNSTICO Los fibromas uterinos se diagnostican con un examen fsico. El mdico puede palpar los tumores grumosos durante un examen plvico. Pueden realizarse ecografas y Ardelia Mems resonancia magntica para determinar el tamao y la ubicacin de los fibromas, as como la cantidad. TRATAMIENTO El tratamiento puede incluir lo siguiente:  Observacin cautelosa. Esto requiere que el mdico controle el fibroma para saber si crece o se achica. Siga las recomendaciones del mdico respecto de la frecuencia con la que debe realizarse los controles.  Medicamentos hormonales. Pueden tomarse por va oral o administrarse a travs de un dispositivo intrauterino (DIU).  Ciruga.  Extirpacin de los fibromas (miomectoma) o del tero (histerectoma).  Suprimir la irrigacin sangunea a los fibromas (embolizacin de la arteria uterina). Si los fibromas le traen problemas de fertilidad y tiene deseos de quedar [place], el mdico puede recomendar su extirpacin. INSTRUCCIONES PARA EL CUIDADO EN EL HOGAR  Concurra a todas las visitas de control como se lo haya indicado el mdico. Esto es  importante.  Tome los medicamentos de venta libre y los recetados solamente como se lo haya indicado el mdico.  Si  le recetaron un tratamiento hormonal, tome los medicamentos hormonales exactamente como se lo indicaron.  Consulte al [employer] sobre tomar comprimidos de hierro y Garment/textile technologist la cantidad de verduras de hoja color verde oscuro en la dieta. Estas medidas pueden ayudar a Transport planner de hierro en la [place], que pueden verse afectados por las hemorragias menstruales intensas.  Preste mucha atencin a Hydrographic surveyor e informe al mdico si hay algn cambio, por ejemplo:  Aumento del flujo de sangre que le exige el uso de ms compresas o tampones que los que utiliza normalmente cada mes.  Un cambio en la cantidad de [employer] dura la menstruacin cada mes.  Un cambio en los sntomas asociados con la [place], como clicos abdominales o dolor de espalda. SOLICITE ATENCIN MDICA SI:  Tiene dolor plvico, dolor de espalda o clicos abdominales que los medicamentos no Engineer, petroleum.  Observa un aumento del sangrado entre y [employer].  Empapa los tampones o las compresas en el trmino de media hora o [place].  Se siente mareada, muy cansada o dbil. SOLICITE ATENCIN MDICA DE INMEDIATO SI:  Se desmaya.  El dolor plvico aumenta repentinamente. Esta informacin no tiene Marine scientist el consejo del mdico. Asegrese de hacerle al mdico cualquier pregunta que tenga. Document Released: 03/06/2005 Document Revised: 06/28/2015 Document Reviewed: 09/02/2013 Elsevier Interactive Patient Education  2017 [place] colesterol  Los niveles de colesterol en el organismo estn determinados significativamente por su dieta. Los niveles de colesterol tambin se relacionan con la enfermedad cardaca. El material que sigue ayuda a Event organiser relacin y a Physiological scientist qu puede hacer para mantener su corazn sano. No todo el colesterol es [place]. Las lipoprotenas de baja densidad (LDL) forman el  colesterol "malo". El colesterol malo puede ocasionar depsitos de grasa que se acumulan en el interior de las arterias. Las lipoprotenas de alta densidad (HDL) es el colesterol "bueno". Ayuda a remover el colesterol LDL "malo" de la [place]. El colesterol es un factor de riesgo muy importante para la enfermedad cardaca. Otros factores de riesgo son la hipertensin arterial, el hbito de fumar, el estrs, la herencia y [place].   El msculo cardaco obtiene el suministro de sangre a travs de las arterias coronarias. Si su colesterol LDL ("malo") est elevado y el HDL ("bueno") es bajo, tiene un factor de riesgo para que se formen depsitos de Lobbyist en las arterias coronarias (los vasos sanguneos que suministran sangre al corazn). Esto hace que haya menos lugar para que la sangre circule. Sin la suficiente sangre y oxgeno, el msculo cardaco no puede funcionar correctamente, y usted podr sentir dolores en el pecho (angina pectoris). Cuando una arteria coronaria se cierra completamente, una parte del msculo cardaco puede morir (infarto de miocardio).  CONTROL DEL COLESTEROL Cuando el profesional que lo asiste enva la sangre al laboratorio para Armed forces logistics/support/administrative officer nivel de colesterol, puede realizarle tambin un perfil completo de los lpidos. Con esta prueba, se puede determinar la cantidad total de colesterol, as como los niveles de LDL y HDL. Los triglicridos  son un tipo de grasa que circula en la sangre y que tambin puede utilizarse para determinar el riesgo de enfermedad cardaca. En la siguiente tabla se establecen los nmeros ideales: Prueba: Colesterol total  Menos de 200 mg/dl.  Prueba: LDL "colesterol malo"  Menos de 100 mg/dl.   Menos de 70 mg/dl si tiene riesgo muy elevado de sufrir un ataque cardaco o muerte cardaca sbita.  Prueba: HDL "colesterol bueno"  Mujeres: Ms de 50 mg/dl.   Hombres: Ms de 40 mg/dl.  Prueba: Trigliceridos  Menos de 742 mg/dl.    CONTROL DEL  COLESTEROL CON DIETA Aunque factores como el ejercicio y el estilo de vida son importantes, la "primera lnea de ataque" es la dieta. Esto se debe a que se sabe que ciertos alimentos hacen subir el colesterol y otros lo [place]. El objetivo debe ser [employer] alimentos, de modo que tengan un efecto sobre el colesterol y, an ms importante, Training and development officer las grasas saturadas y trans con otros tipos de grasas, como las monoinsaturadas y las poliinsaturadas y cidos grasos omega-3 . En promedio, una persona no debe consumir ms de 15 a 17 g de grasas saturadas por [employer]. Las grasas saturadas y trans se consideran grasas "malas", ya que elevan el colesterol LDL. Las grasas saturadas se encuentran principalmente en productos animales como carne, [place] y crema. Pero esto no significa que usted Regulatory affairs officer todas sus comidas favoritas. Actualmente, como lo muestra el cuadro que figura al final de este documento, hay sustitutos de buen sabor, bajos en grasas y en colesterol, para la mayora de los alimentos que a usted Biomedical engineer. Elija aquellos alimentos alternativos que sean bajos en grasas o sin grasas. Elija cortes de carne del cuarto trasero o lomo ya que estos cortes son los que tienen menor cantidad de grasa y Research officer, trade union. El pollo (sin piel), el pescado, la carne de ternera, y la [place] molida son excelentes opciones. Elimine las carnes [employer] o el salami. Los [employer] o nada de grasas saturadas. Cuando consuma carne [place], carne de aves de corral, o pescado, hgalo en porciones de 85 gramos (3 onzas). Las grasas trans tambin se llaman "aceites parcialmente hidrogenados". Son aceites manipulados cientficamente de [place] que son slidos a Engineer, water, tienen una larga vida y Therapist, art sabor y la textura de los alimentos a los que se Pension scheme manager. Las grasas trans se encuentran en la [place], [place], crackers y alimentos horneados.  Para hornear y cocinar, el  aceite es un excelente sustituto para la [place]. Los aceites monoinsaturados tienen un beneficio particular, ya que se cree que disminuyen el colesterol LDL (colesterol malo) y elevan el HDL. Deber evitar los aceites tropicales saturados como el de coco y el de [place].  Recuerde, adems, que puede comer sin restricciones los grupos de alimentos que son naturalmente libres de grasas saturadas y Physicist, medical trans, entre los que se incluyen el pescado, las frutas (excepto el [place]), verduras, frijoles, cereales (cebada, arroz, [place], trigo) y las pastas (sin salsas con crema)   IDENTIFIQUE LOS ALIMENTOS QUE DISMINUYEN EL COLESTEROL  Pueden disminuir el colesterol las fibras solubles que estn en las frutas, como las [place], en los vegetales como el brcoli, las patatas y las zanahorias; en las legumbres como frijoles, guisantes y Engineer, technical sales; y en los cereales como la cebada. Los alimentos fortificados con fitosteroles tambin Forensic psychologist. Debe consumir al menos 2 g de estos alimentos a diario para Software engineer de  disminucin de colesterol.  En el supermercado, lea las etiquetas de los envases para identificar los alimentos bajos en grasas saturadas, libres de grasas trans y bajos en [place], . Elija quesos que tengan solo de 2 a 3 g de grasa saturada por onza (28,35 g). Use una margarina que no dae el corazn, [place] de grasas trans o aceite parcialmente hidrogenado. Al comprar alimentos horneados (galletitas dulces y Administrator) evite el aceite parcialmente hidrogenado. Los panes y bollos debern ser de granos enteros (harina de maz o de avena entera, en lugar de "harina" o "harina enriquecida"). Compre sopas en lata que no sean cremosas, con bajo contenido de sal y sin grasas adicionadas.   TCNICAS DE PREPARACIN DE LOS ALIMENTOS  Nunca fra los alimentos en aceite abundante. Si debe frer, hgalo en poco aceite y removiendo [place], porque as se utilizan muy pocas grasas, o  utilice un spray antiadherente. Cuando le sea posible, hierva, hornee o ase las carnes y cocine los vegetales al vapor. En vez de [employer] con mantequilla o [place], utilice limn y hierbas, pur de Archivist y canela (para las calabazas y batatas), yogurt y salsa descremados y aderezos para ensaladas bajos en contenido graso.   BAJO EN GRASAS SATURADAS / SUSTITUTOS BAJOS EN GRASA  [place]arnes / Grasas saturadas (g)  Evite: Bife, corte graso (3 oz/85 g) / 11 g   Elija: Bife, corte magro (3 oz/85 g) / 4 g   Evite: [place] (3 oz/85 g) / 7 g   Elija:  [place] magra (3 oz/85 g) / 5 g   Evite: Jamn (3 oz/85 g) / 6 g   Elija:  Jamn magro (3 oz/85 g) / 2.4 g   Evite: Pollo, con piel (3 oz/85 g), Carne oscura / 4 g   Elija:  Pollo, sin piel (3 oz/85 g), Carne oscura / 2 g   Evite: Pollo, con piel (3 oz/85 g), Carne magra / 2.5 g   Elija: Pollo, sin piel (3 oz/85 g), Carne magra / 1 g  Lcteos / Grasas saturadas (g)  Evite: Leche entera (1 taza) / 5 g   Elija: Leche con bajo contenido de grasa, 2% (1 taza) / 3 g   Elija: Leche con bajo contenido de grasa, 1% (1 taza) / 1.5 g   Elija: Leche descremada (1 taza) / 0.3 g   Evite: Queso duro (1 oz/28 g) / 6 g   Elija: Queso descremado (1 oz/28 g) / 2-3 g   Evite: Queso cottage, 4% grasa (1 taza)/ 6.5 g   Elija: Queso cottage con bajo contenido de grasa, 1% grasa (1 taza)/ 1.5 g   Evite: Helado (1 taza) / 9 g   Elija: Sorbete (1 taza) / 2.5 g   Elija: Yogurt helado sin contenido de grasa (1 taza) / 0.3 g   Elija: Barras de fruta congeladas / vestigios   Evite: Crema batida (1 cucharada) / 3.5 g   Elija: Batidos glac sin lcteos (1 cucharada) / 1 g  Condimentos / Grasas saturadas (g)  Evite: [place]onesa (1 cucharada) / 2 g   Elija: Mayonesa con bajo contenido de grasa (1 cucharada) / 1 g   Evite: [place] (1 cucharada) / 7 g   Elija: Margarina extra light (1 cucharada) / 1 g   Evite: Aceite de coco (1  cucharada) / 11.8 g   Elija: Aceite de oliva (1 cucharada) / 1.8 g   Elija: Aceite de maz (1 cucharada) / 1.7 g   Elija:  Aceite de crtamo (1 cucharada) / 1.2 g   Elija: Aceite de girasol (1 cucharada) / 1.4 g   Elija: Aceite de soja (1 cucharada) / 2.4 g   Elija: Aceite de canola (1 cucharada) / 1 g  Document Released: 03/06/2005 Document Revised: 11/16/2010 [facility] Patient Information 2012 [place], [place].  Ejercicios para perder peso (Exercise to Lose Weight) La actividad fsica y Ardelia Mems dieta saludable ayudan a perder peso. El mdico podr sugerirle ejercicios especficos. IDEAS Y CONSEJOS PARA HACER EJERCICIOS  Elija opciones econmicas que disfrute hacer , como caminar, andar en bicicleta o los vdeos para ejercitarse.   Utilice las Clinical cytogeneticist del ascensor.   Camine durante la hora del almuerzo.   Estacione el auto lejos del lugar de [place] o [place].   Concurra a un gimnasio o tome clases de gimnasia.   Comience con 5  10 minutos de actividad fsica por da. Ejercite hasta 30 minutos, 4 a 6 das por semana.   Utilice zapatos que tengan un buen soporte y ropas cmodas.   Elongue antes y despus de Chief Technology Officer.   Ejercite hasta que aumente la respiracin y el corazn palpite rpido.   Beba agua extra cuando ejercite.   No haga ejercicio Contractor, sentirse mareado o que le falte mucho el aire.  La actividad fsica puede quemar alrededor de 150 caloras.  Correr 20 cuadras en 15 minutos.   Jugar vley durante 45 a 60 minutos.   Limpiar y encerar el auto durante 45 a 60 minutos.   Jugar ftbol americano de toque.   Caminar 25 cuadras en 35 minutos.   Empujar un cochecito 20 cuadras en 30 minutos.   Jugar baloncesto durante 30 minutos.   Rastrillar hojas secas durante 30 minutos.   Andar en bicicleta 80 cuadras en 30 minutos.   Caminar 30 cuadras en 30 minutos.   Bailar durante 30 minutos.   Quitar la nieve con una pala durante 15  minutos.   Nadar vigorosamente durante 20 minutos.   Subir escaleras durante 15 minutos.   Andar en bicicleta 60 cuadras durante 15 minutos.   Arreglar el jardn entre 30 y 32 minutos.   Saltar a la soga durante 15 minutos.   Limpiar vidrios o pisos durante 45 a 60 minutos.  Document Released: 06/10/2010 Document Revised: 11/16/2010 [facility] Patient Information 2012 [place].

## 2016-08-02 ENCOUNTER — Encounter: Payer: Self-pay | Admitting: Gynecology

## 2018-03-19 ENCOUNTER — Encounter: Payer: BLUE CROSS/BLUE SHIELD | Admitting: Obstetrics & Gynecology

## 2018-03-21 ENCOUNTER — Encounter: Payer: BLUE CROSS/BLUE SHIELD | Admitting: Obstetrics & Gynecology

## 2018-05-29 ENCOUNTER — Ambulatory Visit (INDEPENDENT_AMBULATORY_CARE_PROVIDER_SITE_OTHER): Payer: Self-pay | Admitting: Obstetrics & Gynecology

## 2018-05-29 ENCOUNTER — Other Ambulatory Visit: Payer: Self-pay

## 2018-05-29 ENCOUNTER — Encounter: Payer: Self-pay | Admitting: Obstetrics & Gynecology

## 2018-05-29 VITALS — BP 126/86 | Ht 62.0 in | Wt 177.0 lb

## 2018-05-29 DIAGNOSIS — Z308 Encounter for other contraceptive management: Secondary | ICD-10-CM

## 2018-05-29 DIAGNOSIS — Z01419 Encounter for gynecological examination (general) (routine) without abnormal findings: Secondary | ICD-10-CM

## 2018-05-29 DIAGNOSIS — Z6832 Body mass index (BMI) 32.0-32.9, adult: Secondary | ICD-10-CM

## 2018-05-29 DIAGNOSIS — E6609 Other obesity due to excess calories: Secondary | ICD-10-CM

## 2018-05-29 NOTE — Progress Notes (Signed)
Ruth Rogers [birth year] [id number]   History:    38 y.o. G2P2L2 Married  RP:  Established patient presenting for annual gyn exam   HPI: Menstrual periods every month, normal flow.  No breakthrough bleeding.  No pelvic pain, except occasional left lower quadrant cramping, probably intestinal.  No pain with intercourse.  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  Declines contraception.  Breast normal.  Body mass index 32.37.  Not exercising regularly.  Health labs with family physician.  Past medical history,surgical history, family history and social history were all reviewed and documented in the EPIC chart.  Gynecologic History Patient's last menstrual period was 05/14/2018. Contraception: Declined.  H/O Ovulation induction to conceive. Last Pap: 03/2014. Results were: Negative/HPV HR neg. Last mammogram: Never Bone Density: Never Colonoscopy: Never  Obstetric History OB History  Gravida Para Term Preterm AB Living  2 2       2   SAB TAB Ectopic Multiple Live Births               # Outcome Date GA Lbr Len/2nd Weight Sex Delivery Anes PTL Lv  2 Para           1 Para              ROS: A ROS was performed and pertinent positives and negatives are included in the history.  GENERAL: No fevers or chills. HEENT: No change in vision, no earache, sore throat or sinus congestion. NECK: No pain or stiffness. CARDIOVASCULAR: No chest pain or pressure. No palpitations. PULMONARY: No shortness of breath, cough or wheeze. GASTROINTESTINAL: No abdominal pain, nausea, vomiting or diarrhea, melena or bright red blood per rectum. GENITOURINARY: No urinary frequency, urgency, hesitancy or dysuria. MUSCULOSKELETAL: No joint or muscle pain, no back pain, no recent trauma. DERMATOLOGIC: No rash, no itching, no lesions. ENDOCRINE: No polyuria, polydipsia, no heat or cold intolerance. No recent change in weight. HEMATOLOGICAL: No anemia or easy bruising or bleeding. NEUROLOGIC: No  headache, seizures, numbness, tingling or weakness. PSYCHIATRIC: No depression, no loss of interest in normal activity or change in sleep pattern.     Exam:   BP 126/86   Ht 5\' 2"  (1.575 m)   Wt 177 lb (80.3 kg)   LMP 05/14/2018   BMI 32.37 kg/m   Body mass index is 32.37 kg/m.  General appearance : Well developed well nourished female. No acute distress HEENT: Eyes: no retinal hemorrhage or exudates,  Neck supple, trachea midline, no carotid bruits, no thyroidmegaly Lungs: Clear to auscultation, no rhonchi or wheezes, or rib retractions  Heart: Regular rate and rhythm, no murmurs or gallops Breast:Examined in sitting and supine position were symmetrical in appearance, no palpable masses or tenderness,  no skin retraction, no nipple inversion, no nipple discharge, no skin discoloration, no axillary or supraclavicular lymphadenopathy Abdomen: no palpable masses or tenderness, no rebound or guarding Extremities: no edema or skin discoloration or tenderness  Pelvic: Vulva: Normal             Vagina: No gross lesions or discharge  Cervix: No gross lesions or discharge.  Pap reflex done.  Uterus  AV, normal size, shape and consistency, non-tender and mobile  Adnexa  Without masses or tenderness  Anus: Normal   Assessment/Plan:  37 y.o. female for annual exam   1. Encounter for routine gynecological examination with Papanicolaou smear of cervix Normal gynecologic exam.  Pap reflex done.  Breast exam normal.  Will start screening mammogram at age 9,  schedule in April 2020.  Health labs with family physician. - Pap IG w/ reflex to HPV when [facility]  2. Encounter for other contraceptive management Declines contraception.  History of stimulation of ovulation to conceive.  3. Class 1 obesity due to excess calories without serious comorbidity with body mass index (BMI) of 32.0 to 32.9 in adult Recommend a lower calorie/carb diet such as [employer].  Aerobic physical activities 5  times a week and weightlifting every 2 days.  Princess Bruins MD, 4:34 PM 05/29/2018

## 2018-05-30 LAB — PAP IG W/ RFLX HPV ASCU

## 2018-06-02 ENCOUNTER — Encounter: Payer: Self-pay | Admitting: Obstetrics & Gynecology

## 2018-06-02 NOTE — Patient Instructions (Signed)
1. Encounter for routine gynecological examination with Papanicolaou smear of cervix Normal gynecologic exam.  Pap reflex done.  Breast exam normal.  Will start screening mammogram at age 38, schedule in April 2020.  Health labs with family physician. - Pap IG w/ reflex to HPV when [facility]  2. Encounter for other contraceptive management Declines contraception.  History of stimulation of ovulation to conceive.  3. Class 1 obesity due to excess calories without serious comorbidity with body mass index (BMI) of 32.0 to 32.9 in adult Recommend a lower calorie/carb diet such as [employer].  Aerobic physical activities 5 times a week and weightlifting every 2 days.  Clyde Canterbury, fue un placer verle hoy!  Voy a informarle de sus [employer].

## 2020-09-27 ENCOUNTER — Ambulatory Visit: Payer: Self-pay | Admitting: Obstetrics & Gynecology
